# Patient Record
Sex: Male | Born: 1985 | Race: White | Hispanic: No | Marital: Single | Smoking: Never smoker
Health system: Southern US, Community
[De-identification: ages and names within clinical notes are randomized; demographics above are authoritative.]

## PROBLEM LIST (undated history)

## (undated) DIAGNOSIS — Z8614 Personal history of Methicillin resistant Staphylococcus aureus infection: Secondary | ICD-10-CM

## (undated) DIAGNOSIS — Z982 Presence of cerebrospinal fluid drainage device: Secondary | ICD-10-CM

## (undated) DIAGNOSIS — R569 Unspecified convulsions: Secondary | ICD-10-CM

## (undated) DIAGNOSIS — F819 Developmental disorder of scholastic skills, unspecified: Secondary | ICD-10-CM

## (undated) DIAGNOSIS — Q039 Congenital hydrocephalus, unspecified: Secondary | ICD-10-CM

## (undated) HISTORY — DX: Personal history of Methicillin resistant Staphylococcus aureus infection: Z86.14

## (undated) HISTORY — DX: Developmental disorder of scholastic skills, unspecified: F81.9

## (undated) HISTORY — DX: Presence of cerebrospinal fluid drainage device: Z98.2

## (undated) HISTORY — DX: Congenital hydrocephalus, unspecified: Q03.9

## (undated) HISTORY — PX: CIRCUMCISION: SUR203

## (undated) HISTORY — DX: Unspecified convulsions: R56.9

## (undated) HISTORY — PX: VENTRICULOPERITONEAL SHUNT: SHX204

---
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
## 2007-01-10 ENCOUNTER — Ambulatory Visit: Payer: Self-pay | Admitting: Family Medicine

## 2007-02-15 ENCOUNTER — Ambulatory Visit: Payer: Self-pay | Admitting: Family Medicine

## 2007-07-29 ENCOUNTER — Emergency Department (HOSPITAL_COMMUNITY): Admission: EM | Admit: 2007-07-29 | Discharge: 2007-07-29 | Payer: Self-pay | Admitting: Emergency Medicine

## 2008-06-04 ENCOUNTER — Encounter: Admission: RE | Admit: 2008-06-04 | Discharge: 2008-06-04 | Payer: Self-pay | Admitting: Pediatrics

## 2010-04-10 ENCOUNTER — Emergency Department (HOSPITAL_BASED_OUTPATIENT_CLINIC_OR_DEPARTMENT_OTHER): Admission: EM | Admit: 2010-04-10 | Discharge: 2010-04-11 | Payer: Self-pay | Admitting: Emergency Medicine

## 2010-05-01 ENCOUNTER — Emergency Department (HOSPITAL_COMMUNITY): Admission: EM | Admit: 2010-05-01 | Discharge: 2010-05-01 | Payer: Self-pay | Admitting: Emergency Medicine

## 2010-06-08 ENCOUNTER — Emergency Department (HOSPITAL_COMMUNITY): Admission: EM | Admit: 2010-06-08 | Discharge: 2010-06-08 | Payer: Self-pay | Admitting: Emergency Medicine

## 2010-08-23 ENCOUNTER — Emergency Department (HOSPITAL_COMMUNITY): Admission: EM | Admit: 2010-08-23 | Discharge: 2010-08-23 | Payer: Self-pay | Admitting: Emergency Medicine

## 2010-09-30 ENCOUNTER — Emergency Department (HOSPITAL_COMMUNITY)
Admission: EM | Admit: 2010-09-30 | Discharge: 2010-10-01 | Payer: Self-pay | Source: Home / Self Care | Admitting: Emergency Medicine

## 2010-12-15 IMAGING — CR DG LUMBAR SPINE COMPLETE 4+V
6 series · 6 of 6 positions shown · non-contrast
Comparison: None

CLINICAL DATA: Seizure, fall, and low back pain.

LUMBAR SPINE - COMPLETE 4+ VIEW

[t l-spine a.p.]
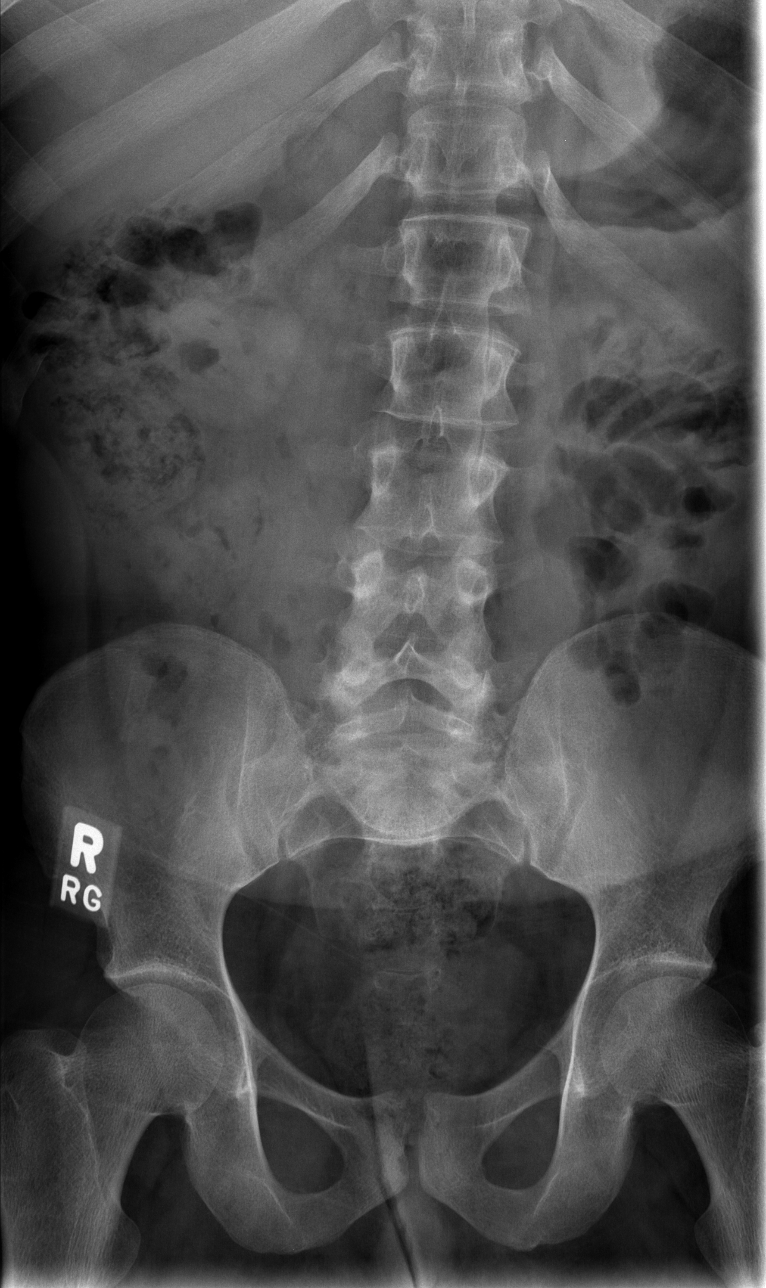

[t l-spine oblique exposure (1 of 3)]
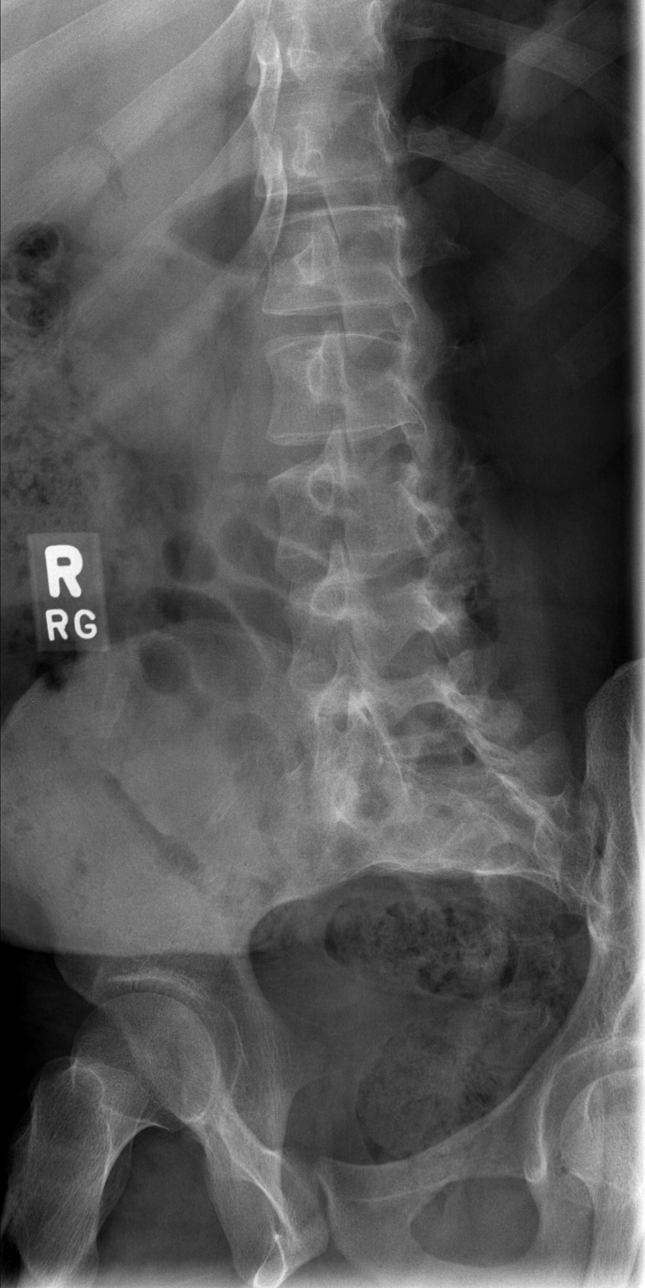

[t l-spine oblique exposure (2 of 3)]
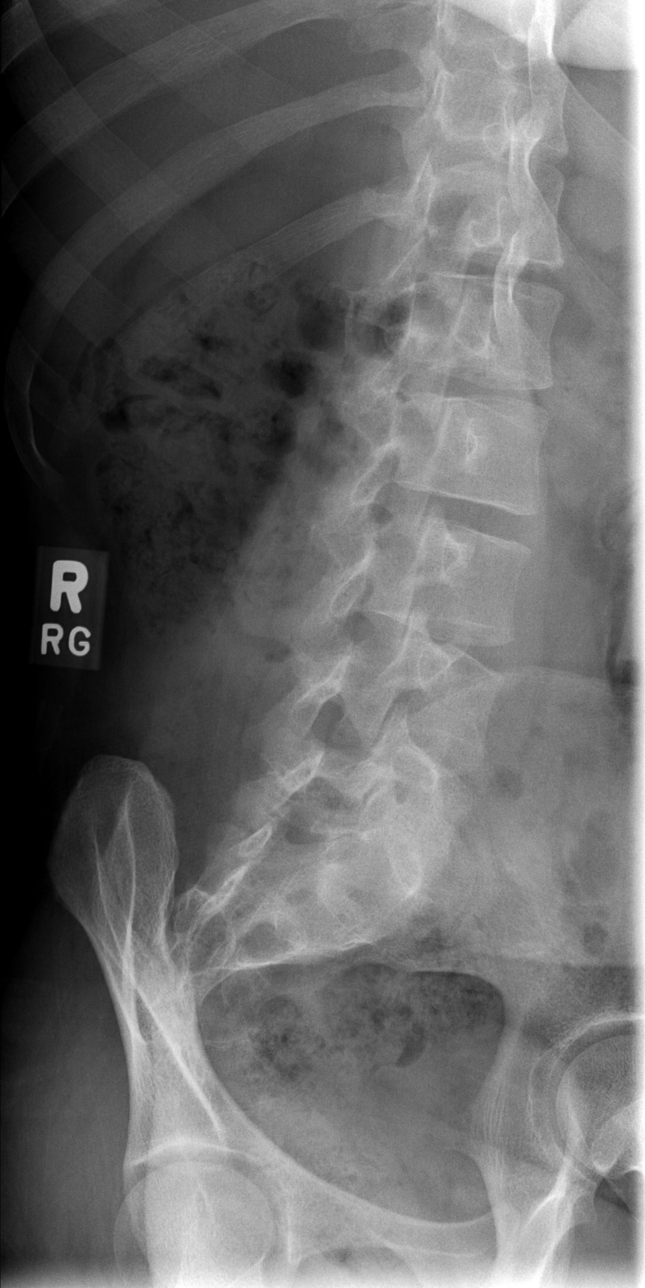

[t l-spine oblique exposure (3 of 3)]
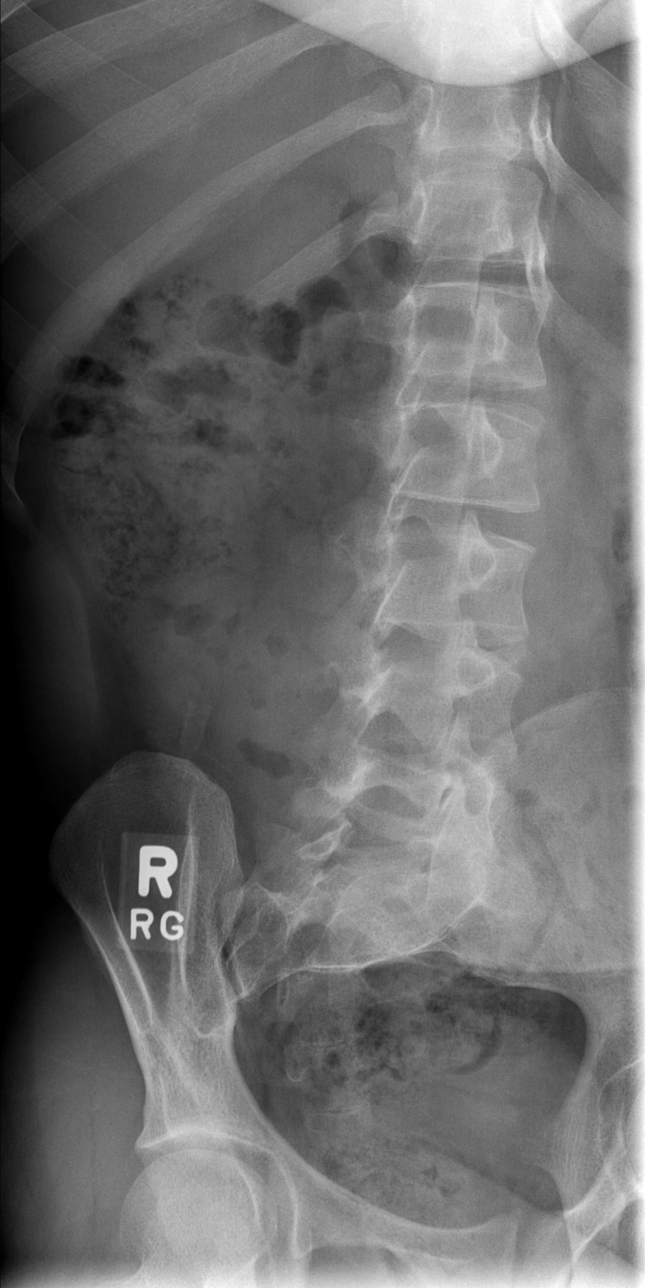

[t l-spine lat]
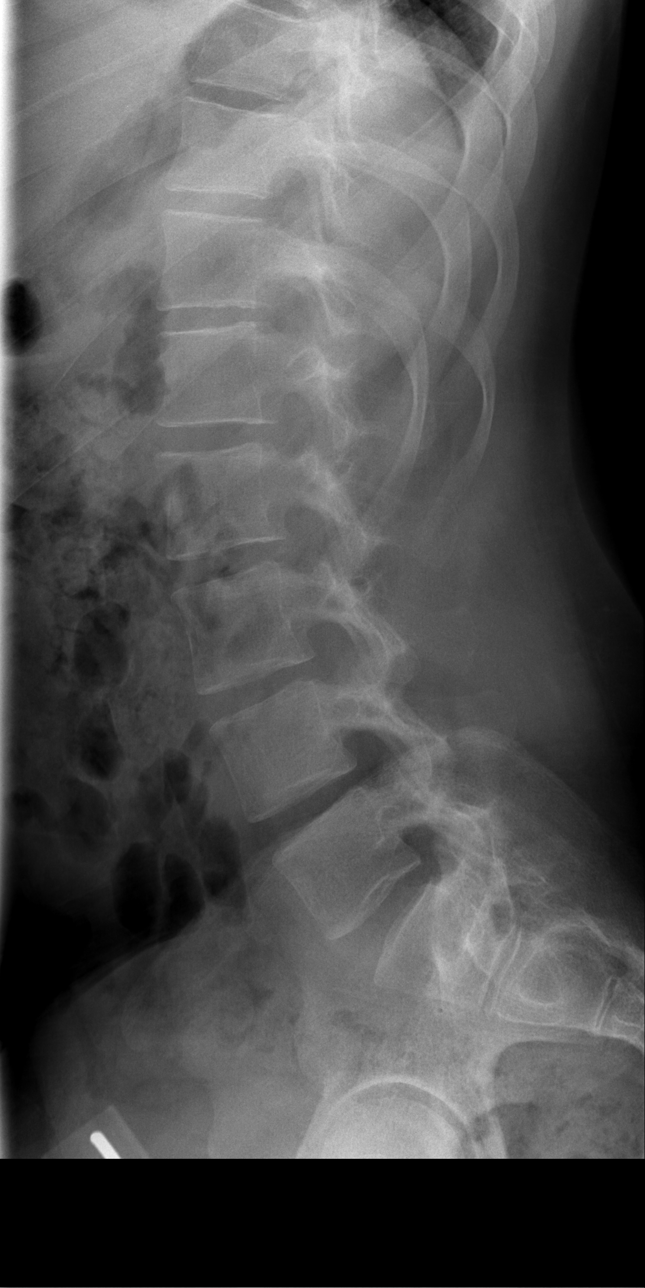

[t l-spine l5-s1 spot]
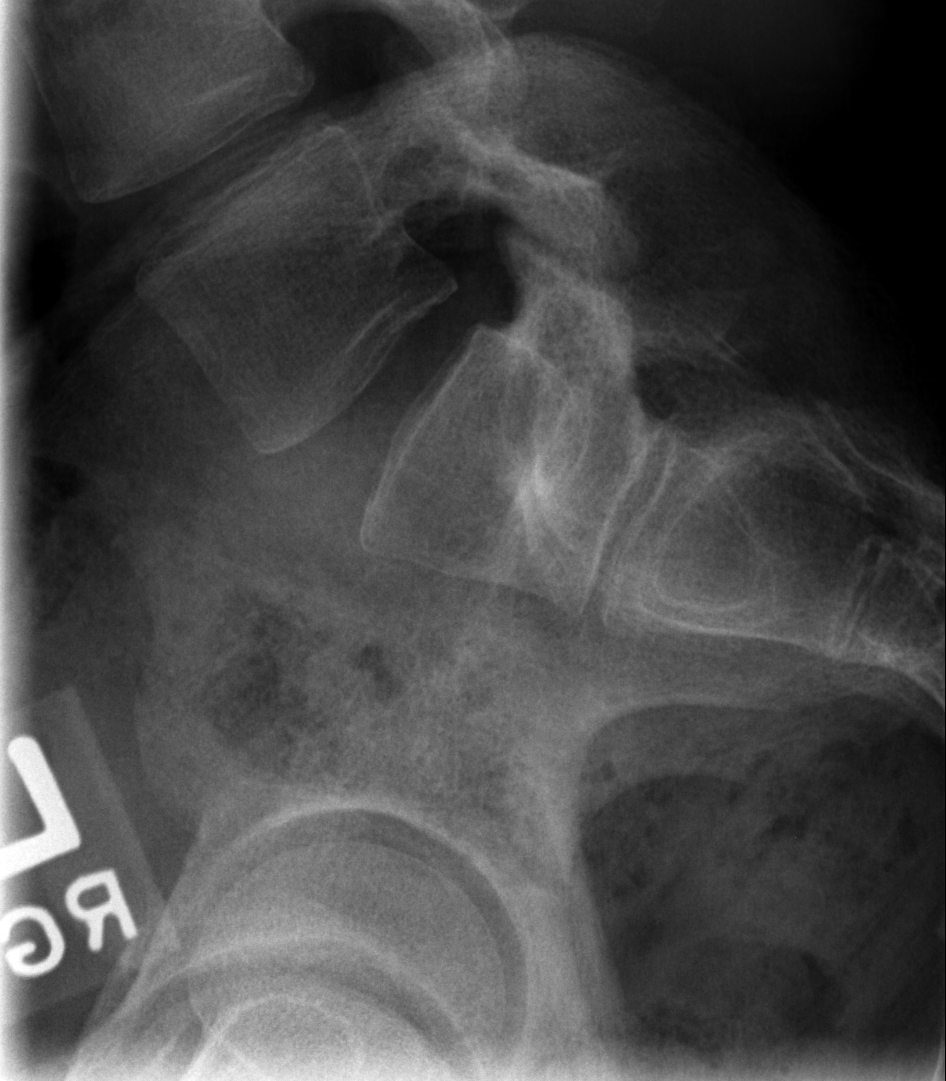

[6 of 6 positions shown; findings below may reference images not displayed]

FINDINGS: Five non-rib bearing lumbar type vertebra are identified
in normal alignment.
There is no evidence of acute fracture or subluxation.
The disc spaces are maintained.
No evidence of spondylolysis or focal bony lesions noted.
IMPRESSION: Unremarkable lumbar spine series.

## 2010-12-15 IMAGING — CR DG CERVICAL SPINE COMPLETE 4+V
8 series · 8 of 8 positions shown · non-contrast
Comparison: None

CLINICAL DATA: Seizure, fall and neck pain.

CERVICAL SPINE - COMPLETE 4+ VIEW

[w c-spine lat]
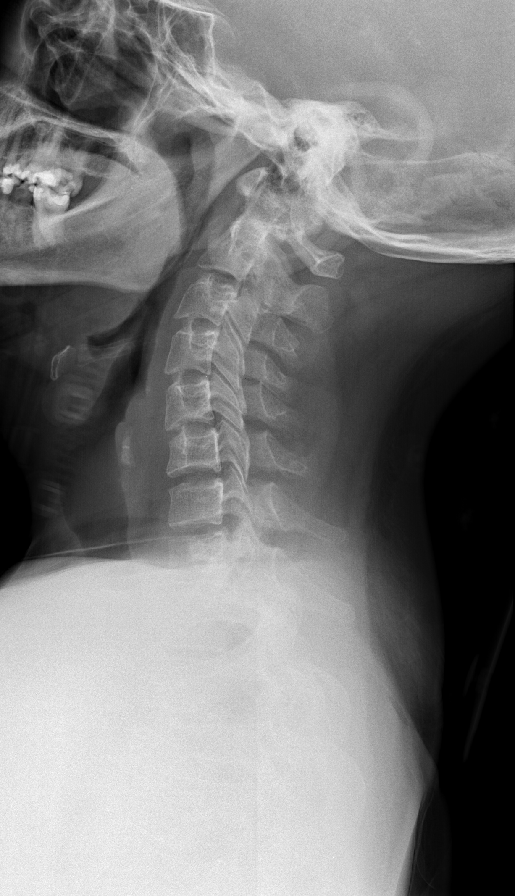

[w c-spine oblique (1 of 3)]
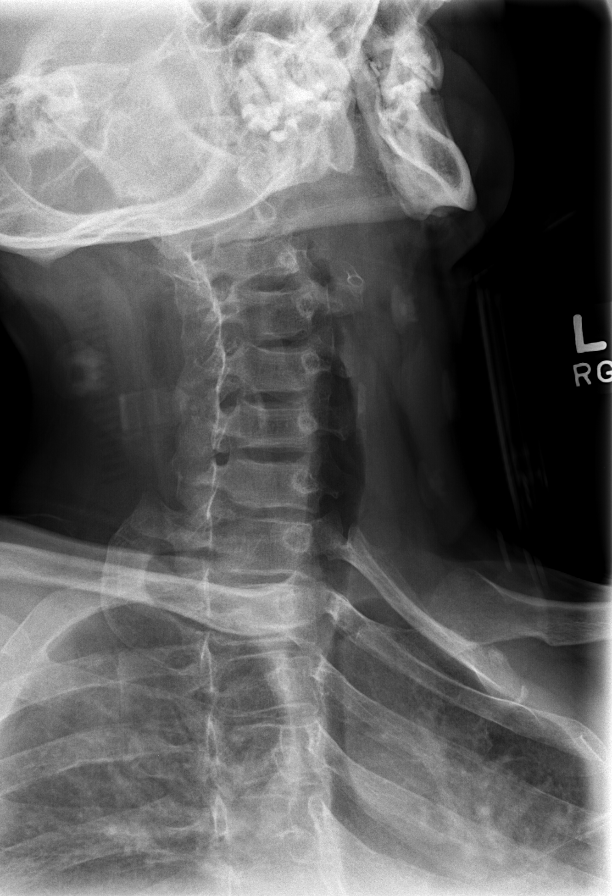

[w c-spine oblique (2 of 3)]
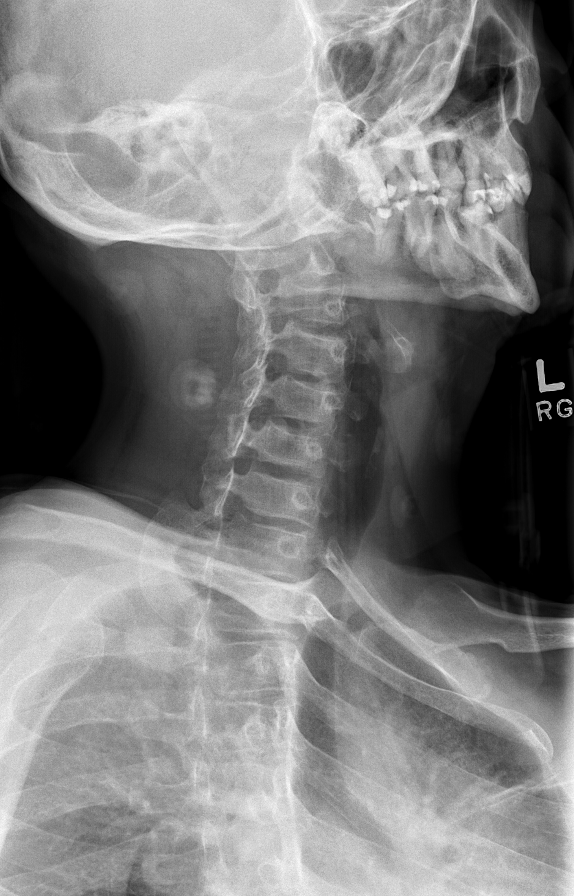

[w c-spine oblique (3 of 3)]
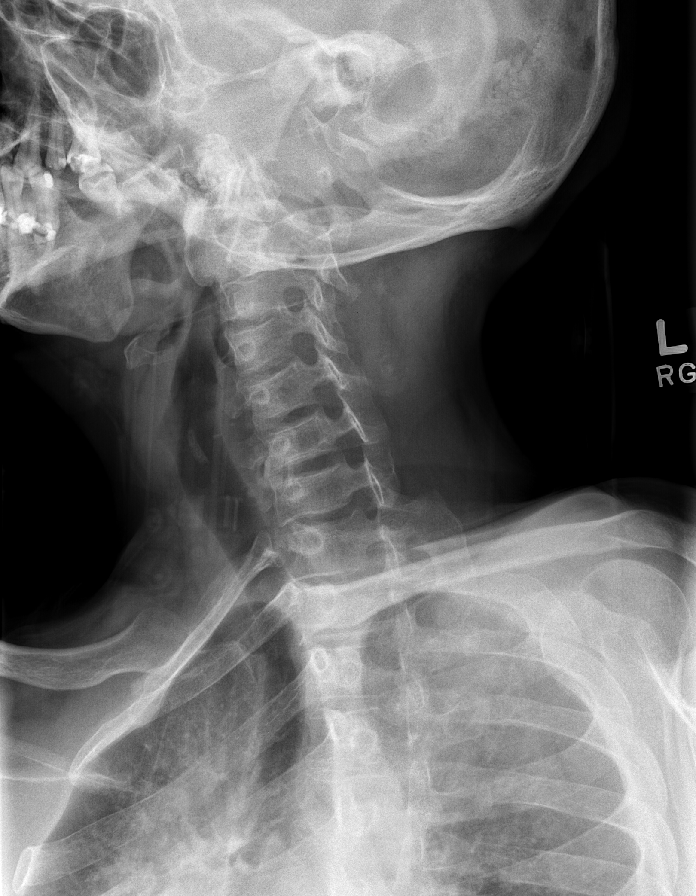

[w c-spine a.p. *]
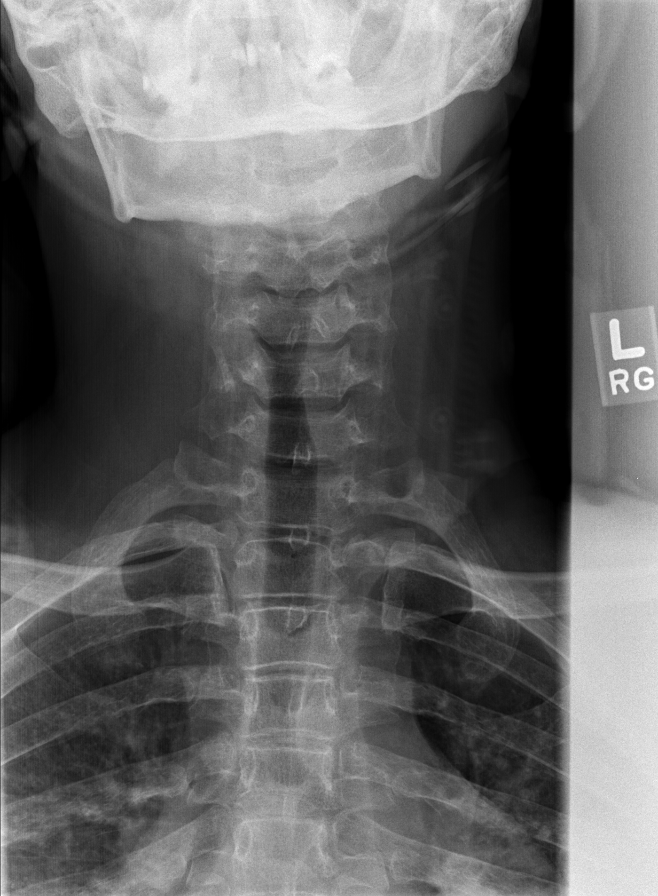

[w c-spine odontoid * (1 of 3)]
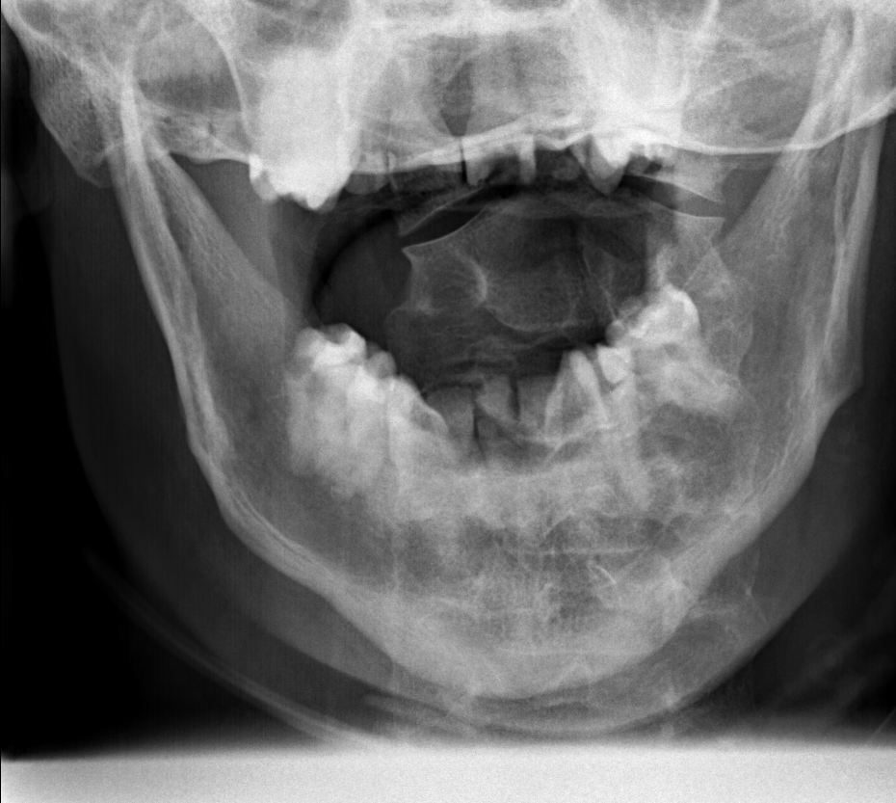

[w c-spine odontoid * (2 of 3)]
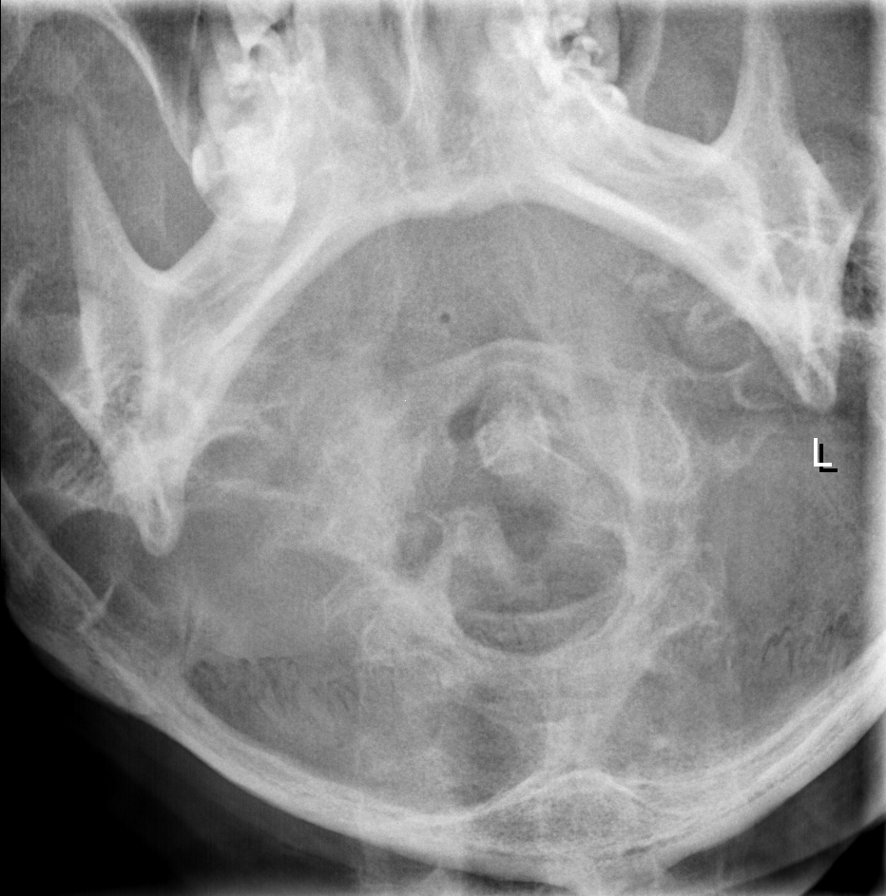

[w c-spine odontoid * (3 of 3)]
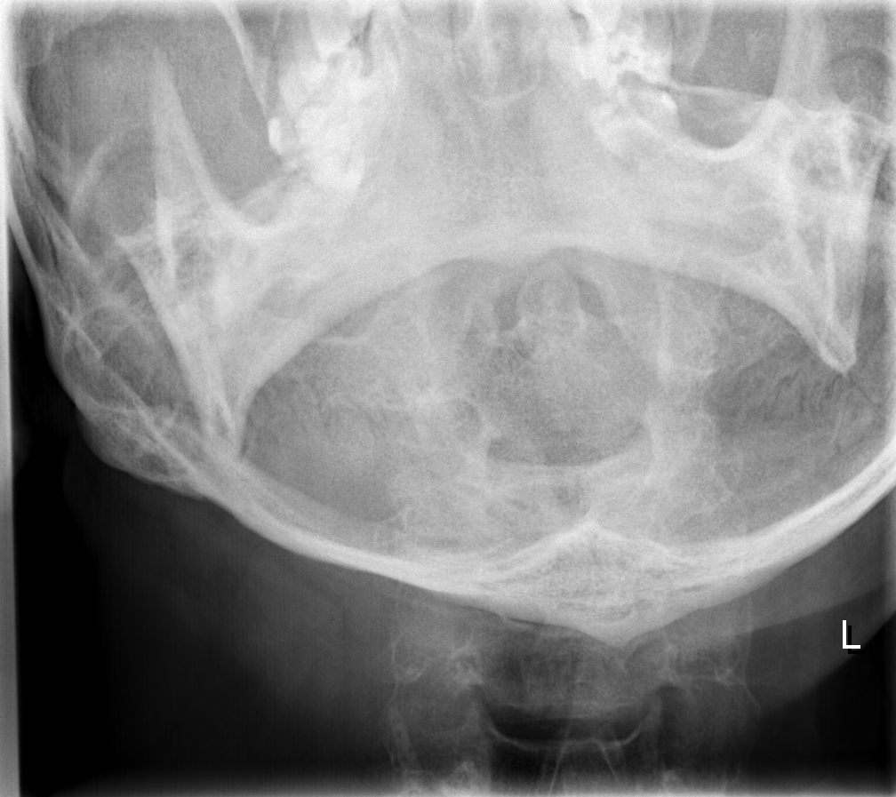

[8 of 8 positions shown; findings below may reference images not displayed]

FINDINGS: Normal alignment is noted.
There is no evidence of acute fracture, subluxation, or
prevertebral soft tissue swelling.
The disc spaces are maintained.
No focal bony lesions are identified.
IMPRESSION: No static evidence of acute injury to the cervical spine.

## 2010-12-21 LAB — URINE MICROSCOPIC-ADD ON

## 2010-12-21 LAB — BASIC METABOLIC PANEL WITH GFR
BUN: 6 mg/dL (ref 6–23)
Creatinine, Ser: 0.69 mg/dL (ref 0.4–1.5)
GFR calc non Af Amer: >60 mL/min (ref >60)
Sodium: 127 meq/L — ABNORMAL LOW (ref 135–145)

## 2010-12-21 LAB — DIFFERENTIAL
Basophils Absolute: 0 10*3/uL (ref 0.0–0.1)
Basophils Relative: 0 % (ref 0–1)
Eosinophils Absolute: 0.1 10*3/uL (ref 0.0–0.7)
Eosinophils Relative: 1 % (ref 0–5)
Lymphocytes Relative: 23 % (ref 12–46)
Lymphs Abs: 2.4 K/uL (ref 0.7–4.0)
Monocytes Absolute: 0.8 10*3/uL (ref 0.1–1.0)
Monocytes Relative: 7 % (ref 3–12)
Neutro Abs: 7.3 K/uL (ref 1.7–7.7)
Neutrophils Relative %: 68 % (ref 43–77)

## 2010-12-21 LAB — CBC
HCT: 38.4 % — ABNORMAL LOW (ref 39.0–52.0)
Hemoglobin: 13.8 g/dL (ref 13.0–17.0)
MCH: 29.7 pg (ref 26.0–34.0)
MCHC: 35.9 g/dL (ref 30.0–36.0)
MCV: 82.6 fL (ref 78.0–100.0)
Platelets: 300 K/uL (ref 150–400)
RBC: 4.65 MIL/uL (ref 4.22–5.81)
RDW: 11.8 % (ref 11.5–15.5)
WBC: 10.6 K/uL — ABNORMAL HIGH (ref 4.0–10.5)

## 2010-12-21 LAB — URINALYSIS, ROUTINE W REFLEX MICROSCOPIC
Bilirubin Urine: NEGATIVE
Glucose, UA: NEGATIVE mg/dL
Ketones, ur: NEGATIVE mg/dL
Leukocytes, UA: NEGATIVE
Nitrite: NEGATIVE
Protein, ur: NEGATIVE mg/dL
Specific Gravity, Urine: 1.013 (ref 1.005–1.030)
Urobilinogen, UA: 0.2 mg/dL (ref 0.0–1.0)
pH: 5.5 (ref 5.0–8.0)

## 2010-12-21 LAB — BASIC METABOLIC PANEL
CO2: 26 mEq/L (ref 19–32)
Calcium: 9.6 mg/dL (ref 8.4–10.5)
Chloride: 92 mEq/L — ABNORMAL LOW (ref 96–112)
GFR calc Af Amer: >60 mL/min (ref >60)
Glucose, Bld: 89 mg/dL (ref 70–99)
Potassium: 3.4 mEq/L — ABNORMAL LOW (ref 3.5–5.1)

## 2010-12-21 LAB — VALPROIC ACID LEVEL: Valproic Acid Lvl: 89.4 ug/mL (ref 50.0–100.0)

## 2010-12-22 LAB — DIFFERENTIAL
Eosinophils Absolute: 0.3 10*3/uL (ref 0.0–0.7)
Lymphocytes Relative: 26 % (ref 12–46)
Lymphs Abs: 1.9 10*3/uL (ref 0.7–4.0)

## 2010-12-22 LAB — BASIC METABOLIC PANEL
Chloride: 91 mEq/L — ABNORMAL LOW (ref 96–112)
GFR calc Af Amer: >60 mL/min (ref >60)
GFR calc non Af Amer: >60 mL/min (ref >60)
Potassium: 4.4 mEq/L (ref 3.5–5.1)

## 2010-12-22 LAB — RAPID URINE DRUG SCREEN, HOSP PERFORMED
Benzodiazepines: NOT DETECTED
Opiates: NOT DETECTED
Tetrahydrocannabinol: NOT DETECTED

## 2010-12-22 LAB — URINALYSIS, ROUTINE W REFLEX MICROSCOPIC
Leukocytes, UA: NEGATIVE
Nitrite: NEGATIVE
Protein, ur: NEGATIVE mg/dL
Urobilinogen, UA: 0.2 mg/dL (ref 0.0–1.0)

## 2010-12-22 LAB — CBC
HCT: 40.9 % (ref 39.0–52.0)
MCH: 30.3 pg (ref 26.0–34.0)
RDW: 12.5 % (ref 11.5–15.5)

## 2010-12-22 LAB — GLUCOSE, CAPILLARY: Glucose-Capillary: 95 mg/dL (ref 70–99)

## 2010-12-22 LAB — VALPROIC ACID LEVEL: Valproic Acid Lvl: 43.3 ug/mL — ABNORMAL LOW (ref 50.0–100.0)

## 2010-12-25 LAB — URINALYSIS, ROUTINE W REFLEX MICROSCOPIC
Nitrite: NEGATIVE
Protein, ur: 30 mg/dL — AB
Specific Gravity, Urine: 1.02 (ref 1.005–1.030)
Urobilinogen, UA: 1 mg/dL (ref 0.0–1.0)
pH: 6 (ref 5.0–8.0)

## 2010-12-25 LAB — URINE MICROSCOPIC-ADD ON

## 2010-12-25 LAB — VALPROIC ACID LEVEL: Valproic Acid Lvl: 92.7 ug/mL (ref 50.0–100.0)

## 2010-12-26 LAB — BASIC METABOLIC PANEL
BUN: 8 mg/dL (ref 6–23)
Creatinine, Ser: 0.73 mg/dL (ref 0.4–1.5)
Glucose, Bld: 97 mg/dL (ref 70–99)
Sodium: 134 mEq/L — ABNORMAL LOW (ref 135–145)

## 2010-12-26 LAB — RAPID URINE DRUG SCREEN, HOSP PERFORMED
Amphetamines: NOT DETECTED
Barbiturates: NOT DETECTED
Benzodiazepines: NOT DETECTED
Cocaine: NOT DETECTED

## 2010-12-26 LAB — URINALYSIS, ROUTINE W REFLEX MICROSCOPIC
Glucose, UA: NEGATIVE mg/dL
Hgb urine dipstick: NEGATIVE
Ketones, ur: NEGATIVE mg/dL
Protein, ur: NEGATIVE mg/dL
pH: 7.5 (ref 5.0–8.0)

## 2010-12-26 LAB — DIFFERENTIAL
Eosinophils Absolute: 0.1 10*3/uL (ref 0.0–0.7)
Eosinophils Relative: 1 % (ref 0–5)
Lymphocytes Relative: 22 % (ref 12–46)
Lymphs Abs: 2.1 10*3/uL (ref 0.7–4.0)
Monocytes Relative: 5 % (ref 3–12)

## 2010-12-26 LAB — CBC
Platelets: 368 10*3/uL (ref 150–400)
RDW: 12.7 % (ref 11.5–15.5)

## 2011-02-26 NOTE — Procedures (Signed)
EEG NUMBER:  [id number]   HISTORY OF PRESENT ILLNESS:  This is a who is on Depakote for a single  seizure a year ago with no recurrent seizures.  Ordered by Dr. Delaney Meigs, M.D. and also Dr. Ellison Carwin.   FINDINGS:  The patient is described as being awake and alert clinically  throughout the study. Electrographically, he also appears to be awake and  alert.  This is a 17 channel routine EEG with one channel devoted to EKG  utilizing [employer] 10/20 lead placement system.  The background  consists of an admixture of 8-10 Hz alpha activity which is fairly well  developed, organized and modulated.  It is predominant in the posterior head  regions and reactive to eye opening.  No clear interhemispheric asymmetry is  identified and no definite epileptiform discharges seen although I  questioned some small sharp waves with field around C3, P3 although these do  not really stand out significantly from the background.  No clinical seizure  activity is noted.  Activation procedures including both hyperventilation  and photic stimulation were performed and did not produce any significant  change in the background activity.  The EKG monitor reveals relatively  regular rhythm with a rate of 78 beats per minute.   CONCLUSION:  Essentially normal EEG without definite seizure activity as  described.  I question some small sharp wave activity emanating from the C3,  P3 region which would be in the left parietal region but these did stand out  appreciably from the background clinical correlation.      Catherine A. Orlin Hilding, M.D.  Electronically Signed     [id number]  D:  10/25/2005 14:06:59  T:  10/25/2005 22:10:42  Job #:  [id number]   cc:   Deanna Artis. Sharene Skeans, M.D.  Fax: [phone]   Delaney Meigs, M.D.  Fax: [phone]

## 2013-03-07 ENCOUNTER — Telehealth: Payer: Self-pay

## 2013-03-07 DIAGNOSIS — G40309 Generalized idiopathic epilepsy and epileptic syndromes, not intractable, without status epilepticus: Secondary | ICD-10-CM

## 2013-03-07 MED ORDER — LACOSAMIDE 100 MG PO TABS: ORAL_TABLET | ORAL | Status: DC

## 2013-03-07 NOTE — Telephone Encounter (Addendum)
[name] lvm stating that pt is about to run out of Vimpat and is on the pt assistance program. She asked that refill be called in. I called mom to get clarification. The last pt assistant packet was sent in and approved in October 17, 2012 for a 6 month supply which was shipped to our office and picked up by mom. The medication was then increased at office visit on October 26, 2012.

## 2013-03-07 NOTE — Telephone Encounter (Signed)
Randall Anderson called me back and said that Hay[place] now has Medicaid. She said that the Medicaid ID number we have on file is correct. She asked that the Vimpat Rx be sent in to [facility].

## 2013-03-07 NOTE — Telephone Encounter (Signed)
Please let Mom know that the Rx has been sent to the pharmacy as requested. TG

## 2013-03-08 NOTE — Telephone Encounter (Signed)
I called [name] and let her know the Rx was at the pharmacy.

## 2013-04-04 ENCOUNTER — Other Ambulatory Visit: Payer: Self-pay | Admitting: Family

## 2013-04-04 DIAGNOSIS — G40309 Generalized idiopathic epilepsy and epileptic syndromes, not intractable, without status epilepticus: Secondary | ICD-10-CM

## 2013-04-04 MED ORDER — DIVALPROEX SODIUM ER 500 MG PO TB24: ORAL_TABLET | ORAL | Status: DC

## 2013-04-17 DIAGNOSIS — Z8614 Personal history of Methicillin resistant Staphylococcus aureus infection: Secondary | ICD-10-CM

## 2013-04-17 DIAGNOSIS — G40309 Generalized idiopathic epilepsy and epileptic syndromes, not intractable, without status epilepticus: Secondary | ICD-10-CM

## 2013-04-17 DIAGNOSIS — G44219 Episodic tension-type headache, not intractable: Secondary | ICD-10-CM | POA: Insufficient documentation

## 2013-04-17 DIAGNOSIS — Z79899 Other long term (current) drug therapy: Secondary | ICD-10-CM

## 2013-04-17 DIAGNOSIS — G911 Obstructive hydrocephalus: Secondary | ICD-10-CM | POA: Insufficient documentation

## 2013-04-17 DIAGNOSIS — F39 Unspecified mood [affective] disorder: Secondary | ICD-10-CM | POA: Insufficient documentation

## 2013-04-17 DIAGNOSIS — F329 Major depressive disorder, single episode, unspecified: Secondary | ICD-10-CM | POA: Insufficient documentation

## 2013-05-15 ENCOUNTER — Telehealth: Payer: Self-pay

## 2013-05-15 DIAGNOSIS — G40309 Generalized idiopathic epilepsy and epileptic syndromes, not intractable, without status epilepticus: Secondary | ICD-10-CM

## 2013-05-15 MED ORDER — DIVALPROEX SODIUM ER 500 MG PO TB24: ORAL_TABLET | ORAL | Status: DC

## 2013-05-15 NOTE — Telephone Encounter (Signed)
Randall Anderson lvm stating that pt is completely out of divalproex. I called mom and she said that she contacted the pharmacy last week and they were supposed to get in contact with our office to get refill approval. I told her that it would be done today and check with the pharmacy later today.

## 2013-05-15 NOTE — Telephone Encounter (Signed)
Rx was sent electronically to pharmacy. TG

## 2013-05-17 ENCOUNTER — Ambulatory Visit (INDEPENDENT_AMBULATORY_CARE_PROVIDER_SITE_OTHER): Payer: Medicaid Other | Admitting: Family

## 2013-05-17 ENCOUNTER — Encounter: Payer: Self-pay | Admitting: Family

## 2013-05-17 VITALS — BP 126/76 | HR 80 | Ht 60.5 in | Wt 124.6 lb

## 2013-05-17 DIAGNOSIS — G44219 Episodic tension-type headache, not intractable: Secondary | ICD-10-CM

## 2013-05-17 DIAGNOSIS — Z8614 Personal history of Methicillin resistant Staphylococcus aureus infection: Secondary | ICD-10-CM

## 2013-05-17 DIAGNOSIS — Z79899 Other long term (current) drug therapy: Secondary | ICD-10-CM

## 2013-05-17 DIAGNOSIS — F39 Unspecified mood [affective] disorder: Secondary | ICD-10-CM

## 2013-05-17 DIAGNOSIS — G40309 Generalized idiopathic epilepsy and epileptic syndromes, not intractable, without status epilepticus: Secondary | ICD-10-CM

## 2013-05-17 DIAGNOSIS — G911 Obstructive hydrocephalus: Secondary | ICD-10-CM

## 2013-05-17 DIAGNOSIS — F329 Major depressive disorder, single episode, unspecified: Secondary | ICD-10-CM

## 2013-05-17 MED ORDER — DIVALPROEX SODIUM ER 500 MG PO TB24: ORAL_TABLET | ORAL | Status: DC

## 2013-05-17 MED ORDER — LEVETIRACETAM 500 MG PO TABS: ORAL_TABLET | ORAL | Status: DC

## 2013-05-17 NOTE — Patient Instructions (Signed)
Increase Levetiracetam (Keppra) 500mg  to 3 tablets in the morning and 3 tablets in the evening. I have sent in a new prescription to the pharmacy for you. Continue taking Vimpat and Divalproex (Depakote) as you have been.  Call me if the small seizures do not improve or if Ethel has side effects from the increased dose of Levetiracetam. Plan to return for follow up in 6 months or sooner if needed.

## 2013-05-17 NOTE — Progress Notes (Signed)
Patient: ALBION WEATHERHOLTZ MRN: [id number] Sex: male DOB: 1986-02-23  Provider: Elveria Rising, NP Location of Care: [facility]  Note type: Routine return visit  History of Present Illness: Referral Source: Dr. Joette Catching History from: Mother Chief Complaint: Seizure Disorder  SAMIL MECHAM is a 27 y.o. male with history of congenital hydrocephalus, VP shunt, cognitive delays, and generalized seizures.  He is taking and tolerating generic Depakote, Levetiracetam and Vimpat for his seizure disorder.   His family reports that he has not had convulsive seizures in more than 6 months. They say that he is having "small seizures" described as staring or staring with some jerks of his limbs with no loss of consciousness occurs 1-2 times per week. He usually has a severe headache after this type of seizure.   [name] is in a better mood today than in previous visits and his family says that his mood is better overall. He continues to not sleep well at night because he fears having a seizure in his sleep and dying.    Review of Systems: 12 system review was remarkable for headache, seizure and tremor  Past Medical History  Diagnosis Date  . Seizures    Hospitalizations: yes, Head Injury: no, Nervous System Infections: no, Immunizations up to date: yes Past Medical History Comments: See surgical Hx for hospitalizations. Past medication history significant for congenital hydrocephalus, headaches and seizures.   Surgical History Past Surgical History  Procedure Laterality Date  . Ventriculoperitoneal shunt      P shunt,  endoscopic surgery to open up the floor of third ventricle and have it communicate with the subarachnoid space.     Family History family history includes Cancer in his father, maternal grandfather, and paternal grandfather; Congestive Heart Failure in his maternal grandmother; and Diabetes in his other. Family History is negative migraines,  seizures, cognitive impairment, blindness, deafness, birth defects, chromosomal disorder, autism.  Social History History   Social History  . Marital Status: Single    Spouse Name: N/A    Number of Children: N/A  . Years of Education: N/A   Social History Main Topics  . Smoking status: Never Smoker   . Smokeless tobacco: Never Used  . Alcohol Use: No  . Drug Use: No  . Sexually Active: None   Other Topics Concern  . None   Social History Narrative  . None   Living with mother and sister    Allergies  Allergen Reactions  . Other Anaphylaxis    Shell Fish    Physical Exam BP 126/76  Pulse 80  Ht 5' 0.5" (1.537 m)  Wt 124 lb 9.6 oz (56.518 kg)  BMI 23.92 kg/m2 General: well developed, well nourished young man with short stature, seated on exam table, in no evident distress Head: head is large and brachycephalic Ears, Nose and Throat: poor dentition Neck: supple with no carotid or supraclavicular bruits. Respiratory: lungs clear to auscultation Cardiovascular: regular rate and rhythm, no murmurs Musculoskeletal: neuromuscular scoliosis - stable Skin: no rashes or lesions  Neurologic Exam  Mental Status: Awake and fully alert.  Oriented to place and time.  He had difficulty in answering questions and looked to his mother for help.  Cranial Nerves: Fundoscopic exam reveals sharp disc margins.  Pupils equal, briskly reactive to light.  Extraocular movements full without nystagmus.  Visual fields full to confrontation.  Hearing intact and symmetric to finger rub.  Facial sensation intact.  Face, tongue, palate move normally and symmetrically.  Neck flexion and extension normal. Motor: Normal bulk and tone.  Normal strength in all tested extremity muscles. Sensory: Refused to answer questions about sensory examination. Coordination: Rapid alternating movements slightly clumsy but otherwise normal in all extremities.  Finger-to-nose and heel-to-shin performed accurately  bilaterally. Romberg negative. Gait and Station: Arises from chair without difficulty.  Stance is normal.  Gait demonstrates normal stride length and balance.  Able to heel, toe, and tandem walk were clumsy but able to do performed without significant difficulty. Reflexes: 1+ and symmetric.  Toes downgoing.  Assessment and Plan Kalep is a 27 year old young man with history of congenital hydrocephalus, VP shunt and generalized seizures. He is taking and tolerating generic Depakote, Levetiracetam and Vimpat for his seizure disorder. The Vimpat has decreased his seizure frequency slightly but he continues to have what sound like partial seizures a few times per week. I instructed his mother to increase Levetiracetam to 3 tablets twice per day. I asked her to call me if he has changes in his mood or becomes sleepy with this dose increase.  I will see him back in follow up in 6 months but asked his mother to stay in touch by phone about his seizures so that we may make adjustments to his antiepileptic medication.

## 2013-09-17 ENCOUNTER — Telehealth: Payer: Self-pay

## 2013-09-17 DIAGNOSIS — G40309 Generalized idiopathic epilepsy and epileptic syndromes, not intractable, without status epilepticus: Secondary | ICD-10-CM

## 2013-09-17 MED ORDER — VIMPAT 100 MG PO TABS: ORAL_TABLET | ORAL | Status: DC

## 2013-09-17 NOTE — Telephone Encounter (Signed)
Rx faxed today as requested. TG

## 2013-09-17 NOTE — Telephone Encounter (Signed)
[name] called and said that pt has been out of Vimpat since yesterday. She is requesting a refill be sent to [facility] on [address]. I told her that we will send over refill and to check w pharmacy in a little while.

## 2013-11-19 ENCOUNTER — Ambulatory Visit (INDEPENDENT_AMBULATORY_CARE_PROVIDER_SITE_OTHER): Payer: Medicaid Other | Admitting: Family

## 2013-11-19 ENCOUNTER — Encounter: Payer: Self-pay | Admitting: Family

## 2013-11-19 VITALS — BP 120/74 | HR 84 | Ht 59.0 in | Wt 117.4 lb

## 2013-11-19 DIAGNOSIS — Z79899 Other long term (current) drug therapy: Secondary | ICD-10-CM

## 2013-11-19 DIAGNOSIS — G911 Obstructive hydrocephalus: Secondary | ICD-10-CM

## 2013-11-19 DIAGNOSIS — G40309 Generalized idiopathic epilepsy and epileptic syndromes, not intractable, without status epilepticus: Secondary | ICD-10-CM

## 2013-11-19 DIAGNOSIS — F3289 Other specified depressive episodes: Secondary | ICD-10-CM

## 2013-11-19 DIAGNOSIS — F39 Unspecified mood [affective] disorder: Secondary | ICD-10-CM

## 2013-11-19 DIAGNOSIS — G44219 Episodic tension-type headache, not intractable: Secondary | ICD-10-CM

## 2013-11-19 DIAGNOSIS — Z8614 Personal history of Methicillin resistant Staphylococcus aureus infection: Secondary | ICD-10-CM

## 2013-11-19 DIAGNOSIS — F329 Major depressive disorder, single episode, unspecified: Secondary | ICD-10-CM

## 2013-11-19 MED ORDER — VIMPAT 100 MG PO TABS: ORAL_TABLET | ORAL | Status: DC

## 2013-11-19 MED ORDER — LEVETIRACETAM 500 MG PO TABS: ORAL_TABLET | ORAL | Status: DC

## 2013-11-19 MED ORDER — DIVALPROEX SODIUM ER 500 MG PO TB24
ORAL_TABLET | ORAL | Status: DC
Start: 2013-11-19 — End: 2014-06-20

## 2013-11-19 NOTE — Progress Notes (Signed)
Patient: Randall Anderson MRN: [id number] Sex: male DOB: May 14, 1986  Provider: Elveria RisingGOODPASTURE, Vernell Back, NP Location of Care: [facility]  Note type: Routine return visit  History of Present Illness: Referral Source: Dr. Joette CatchingLeonard Nyland History from: his mother Chief Complaint: Seizure Disorder  Randall Anderson is a 28 y.o. young man with history of congenital hydrocephalus, VP shunt, cognitive delays, and generalized seizures. He is taking and tolerating generic Depakote, Levetiracetam and Vimpat for his seizure disorder. His mother reports today that Randall Anderson has been doing well since last seen in October 2014. He has occasional "small seizures" described as staring or staring with some jerks of his limbs with no loss of consciousness about once per week. He sometimes has a severe headache after this type of seizure.   Randall Anderson has been healthy since last seen. 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 His mood has been more even and he has been sleeping better.   Review of Systems: 12 system review was remarkable for headache  Past Medical History  Diagnosis Date  . Seizures   . Congenital hydrocephalus   . VP (ventriculoperitoneal) shunt status   . Intellectual delay   . Hx MRSA infection    Hospitalizations: no, Head Injury: no, Nervous System Infections: no, Immunizations up to date: yes Past Medical History Comments: no serious illness since last seen  Surgical History Past Surgical History  Procedure Laterality Date  . Ventriculoperitoneal shunt      P shunt,  endoscopic surgery to open up the floor of third ventricle and have it communicate with the subarachnoid space.     Family History family history includes Cancer in his father, maternal grandfather, and paternal grandfather; Congestive Heart Failure in his maternal grandmother; Diabetes in his other. Family History  is otherwise negative for migraines, seizures, cognitive impairment, blindness, deafness, birth defects, chromosomal disorder, autism.  Social History History   Social History  . Marital Status: Single    Spouse Name: N/A    Number of Children: N/A  . Years of Education: N/A   Social History Main Topics  . Smoking status: Passive Smoke Exposure - Never Smoker  . Smokeless tobacco: Never Used  . Alcohol Use: No  . Drug Use: No  . Sexual Activity: No   Other Topics Concern  . None   Social History Narrative  . None   Educational level: 12th grade School Attending:N/A Living with: mother  Hobbies/Interest: Watching T.V., crossword puzzles, going to church and playing on his computer School comments: Randall Anderson graduated from [employer] in 2008.   Physical Exam BP 120/74  Pulse 84  Ht 4\' 11"  (1.499 m)  Wt 117 lb 6.4 oz (53.252 kg)  BMI 23.70 kg/m2 General: well developed, well nourished young man with short stature, seated on exam table, in no evident distress  Head: head is large and brachycephalic  Ears, Nose and Throat: poor dentition  Neck: supple with no carotid or supraclavicular bruits.  Respiratory: lungs clear to auscultation  Cardiovascular: regular rate and rhythm, no murmurs  Musculoskeletal: neuromuscular scoliosis - stable  Skin: no rashes or lesions   Neurologic Exam  Mental Status: Awake and fully alert. Oriented to place and time. He had difficulty in answering questions and looked to his mother for help.  Cranial Nerves: Fundoscopic exam reveals sharp disc margins. Pupils equal, briskly reactive to light. Extraocular movements full  without nystagmus. Visual fields full to confrontation. Hearing intact and symmetric to finger rub. Facial sensation intact. Face, tongue, palate move normally and symmetrically. Neck flexion and extension normal.  Motor: Normal bulk and tone. Normal strength in all tested extremity muscles.  Sensory: Refused to answer  questions about sensory examination.  Coordination: Rapid alternating movements slightly clumsy but otherwise normal in all extremities. Finger-to-nose and heel-to-shin performed accurately bilaterally. Romberg negative.  Gait and Station: Arises from chair without difficulty. Stance is normal. Gait demonstrates normal stride length and balance. Able to heel, toe, and tandem walk were clumsy but able to do performed without significant difficulty.  Reflexes: 1+ and symmetric. Toes downgoing.   Assessment and Plan Randall Anderson is a 28 year old young man with history of congenital hydrocephalus, VP shunt and generalized seizures. He is taking and tolerating generic Depakote, Levetiracetam and Vimpat for his seizure disorder. He continues to have brief complex partial seizures about once per week but his family is satisfied with his seizure control because he is no longer having generalized tonic clonic seizures and because his mood has greatly improved.  I will see him back in follow up in 6 months. I asked his mother to call me in the interim if his seizures increased in frequency or severity.

## 2013-11-21 ENCOUNTER — Encounter: Payer: Self-pay | Admitting: Family

## 2013-11-21 NOTE — Patient Instructions (Addendum)
Continue [name]'s medication without change. Call me if his seizures become more frequent or get severe. I will see him back in follow up in 6 months or sooner if needed.

## 2013-12-19 ENCOUNTER — Other Ambulatory Visit: Payer: Self-pay | Admitting: Family

## 2014-06-10 ENCOUNTER — Other Ambulatory Visit: Payer: Self-pay | Admitting: Family

## 2014-06-20 ENCOUNTER — Ambulatory Visit (INDEPENDENT_AMBULATORY_CARE_PROVIDER_SITE_OTHER): Payer: Medicaid Other | Admitting: Family

## 2014-06-20 ENCOUNTER — Encounter: Payer: Self-pay | Admitting: Family

## 2014-06-20 VITALS — BP 126/72 | HR 86 | Ht 59.0 in | Wt 116.0 lb

## 2014-06-20 DIAGNOSIS — G911 Obstructive hydrocephalus: Secondary | ICD-10-CM

## 2014-06-20 DIAGNOSIS — Z79899 Other long term (current) drug therapy: Secondary | ICD-10-CM

## 2014-06-20 DIAGNOSIS — G40309 Generalized idiopathic epilepsy and epileptic syndromes, not intractable, without status epilepticus: Secondary | ICD-10-CM

## 2014-06-20 MED ORDER — VIMPAT 100 MG PO TABS: ORAL_TABLET | ORAL | Status: DC

## 2014-06-20 MED ORDER — LEVETIRACETAM 500 MG PO TABS: ORAL_TABLET | ORAL | Status: DC

## 2014-06-20 MED ORDER — DIVALPROEX SODIUM ER 500 MG PO TB24: ORAL_TABLET | ORAL | Status: DC

## 2014-06-20 NOTE — Progress Notes (Signed)
Patient: MICHAIAH HOLSOPPLE MRN: [id number] Sex: male DOB: 1985-12-04  Provider: Elveria Rising, NP Location of Care: [facility]  Note type: Routine return visit  History of Present Illness: Referral Source: Dr. Joette Catching History from: his mother Chief Complaint: Seizure Disorder  LONZELL DORRIS is a 28 y.o. young man with history of history of congenital hydrocephalus, VP shunt, cognitive delays, and generalized seizures. He is taking and tolerating generic Depakote, Levetiracetam and Vimpat for his seizure disorder. He was last seen November 19, 2013. His mother reports today that [name] has been doing well since last seen. She said that he had a few more seizures last week because he ran out of Vimpat for a day or so. Overall, however, his seizures have decreased since being on this medication. He has occasional "small seizures" described as staring or staring with some jerks of his limbs with no loss of consciousness about once every other week. He sometimes has a severe headache after this type of seizure.   [name] has been healthy since last seen. 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 He has had problems with mood and irritability but this has improved as well. His mother has no particular concerns today.  Review of Systems: 12 system review was remarkable for seizures  Past Medical History  Diagnosis Date  . Seizures   . Congenital hydrocephalus   . VP (ventriculoperitoneal) shunt status   . Intellectual delay   . Hx MRSA infection    Hospitalizations: No., Head Injury: No., Nervous System Infections: No., Immunizations up to date: Yes.   Past Medical History Comments: see Hx.  Surgical History Past Surgical History  Procedure Laterality Date  . Ventriculoperitoneal shunt      VP shunt,  endoscopic surgery to open up the floor of third ventricle and have it  communicate with the subarachnoid space.     Family History family history includes Cancer in his father, maternal grandfather, and paternal grandfather; Congestive Heart Failure in his maternal grandmother; Diabetes in his other. Family History is otherwise negative for migraines, seizures, cognitive impairment, blindness, deafness, birth defects, chromosomal disorder, autism.  Social History History   Social History  . Marital Status: Single    Spouse Name: N/A    Number of Children: N/A  . Years of Education: N/A   Social History Main Topics  . Smoking status: Passive Smoke Exposure - Never Smoker  . Smokeless tobacco: Never Used  . Alcohol Use: No  . Drug Use: No  . Sexual Activity: No   Other Topics Concern  . None   Social History Narrative  . None   Educational level: 12th grade School Attending:N/A Living with:  mother  Hobbies/Interest: likes watching TV, crossword puzzles, going to church and play on the computer School comments:  Jiovani graduated from [employer] in 2008. He is currently on disability.  Physical Exam BP 126/72  Pulse 86  Ht  (1.499 m)  Wt 116 lb (52.617 kg)  BMI 23.42 kg/m2 General: well developed, well nourished young man with short stature, seated on exam table, in no evident distress  Head: head is large and brachycephalic  Ears, Nose and Throat: poor dentition  Neck: supple with no carotid or supraclavicular bruits.  Respiratory: lungs clear to auscultation  Cardiovascular: regular rate and rhythm, no murmurs  Musculoskeletal: neuromuscular scoliosis - stable  Skin: no rashes or lesions   Neurologic Exam  Mental Status: Awake and fully alert. Oriented to place and time. He had difficulty in answering questions and looked to his mother for help.  Cranial Nerves: Fundoscopic exam reveals sharp disc margins. Pupils equal, briskly reactive to light. Extraocular movements full without nystagmus. Visual fields full to  confrontation. Hearing intact and symmetric to finger rub. Facial sensation intact. Face, tongue, palate move normally and symmetrically. Neck flexion and extension normal.  Motor: Normal bulk and tone. Normal strength in all tested extremity muscles.  Sensory: Refused to answer questions about sensory examination.  Coordination: Rapid alternating movements slightly clumsy but otherwise normal in all extremities. Finger-to-nose and heel-to-shin performed accurately bilaterally. Romberg negative.  Gait and Station: Arises from chair without difficulty. Stance is normal. Gait demonstrates normal stride length and balance. Able to heel, toe, and tandem walk were clumsy but able to do performed without significant difficulty.  Reflexes: Diminished to1+ and symmetric. Toes downgoing.    Assessment and Plan Milas is a 28 year old young man with history of congenital hydrocephalus, VP shunt and generalized seizures. He is taking and tolerating generic Depakote, Levetiracetam and Vimpat for his seizure disorder. He continues to have occasional brief complex partial seizures about twice per month but his mother is satisfied with his seizure control at this time. She has had some problems with getting his medication filled and I talked with her about how to manage that. I will see him back in follow up in 6 months. I asked his mother to call me in the interim if his seizures increased in frequency or severity.

## 2014-06-20 NOTE — Patient Instructions (Signed)
I have refilled Randall Anderson's medications. If you have problems in the future with getting refills, please let me know.   Please let me know if he has increase in his seizures or any other concerns that you may have.   Please plan to return for follow up in 6 months or sooner if needed.

## 2014-06-23 ENCOUNTER — Encounter: Payer: Self-pay | Admitting: Family

## 2014-07-05 ENCOUNTER — Telehealth: Payer: Self-pay | Admitting: Family

## 2014-07-05 ENCOUNTER — Telehealth: Payer: Self-pay | Admitting: *Deleted

## 2014-07-05 ENCOUNTER — Ambulatory Visit (INDEPENDENT_AMBULATORY_CARE_PROVIDER_SITE_OTHER): Payer: Medicaid Other | Admitting: Family

## 2014-07-05 ENCOUNTER — Encounter: Payer: Self-pay | Admitting: Family

## 2014-07-05 VITALS — BP 116/70 | HR 82 | Ht 59.0 in | Wt 116.4 lb

## 2014-07-05 DIAGNOSIS — Z9181 History of falling: Secondary | ICD-10-CM

## 2014-07-05 DIAGNOSIS — G40309 Generalized idiopathic epilepsy and epileptic syndromes, not intractable, without status epilepticus: Secondary | ICD-10-CM

## 2014-07-05 DIAGNOSIS — G911 Obstructive hydrocephalus: Secondary | ICD-10-CM

## 2014-07-05 DIAGNOSIS — R296 Repeated falls: Secondary | ICD-10-CM

## 2014-07-05 NOTE — Telephone Encounter (Signed)
I called and talked to Mom. She said that Randall Anderson started falling about 2 weeks ago. He tends to fall backwards without warning. He does not seem to be fainting or having a seizure, just falls. She said that with his most recent fall, his leg is very bruised and painful. I asked Mom to bring him in for me to assess him. She agreed and I added him on to my schedule for 12 today. TG

## 2014-07-05 NOTE — Telephone Encounter (Signed)
Thank you, after you see him please come get me so that I can assess him with you.

## 2014-07-05 NOTE — Telephone Encounter (Signed)
I reviewed the CT scan and compared it with the CT scan and affect.  There is no difference area at it shows massive dilatation of the lateral and third ventricles and a fairly normal fourth ventricle.  The patient has a ventriculostomy.  It would appear that his condition is stable and not the cause of his unsteady gait.  I reviewed your note and agree with this plan.

## 2014-07-05 NOTE — Telephone Encounter (Signed)
[name], mom, stated the pt has been falling down 2-3 times a day. She said that the pt's right leg is bruised. The mother said the pt hit his head today when he fell. She does not know if the pt hit his head before today when the mother is not home. The mother said she has applied muscle cream and giving him warm baths with epsom salt. She said this has been happening for two weeks. The mother can be reached at [phone].

## 2014-07-05 NOTE — Progress Notes (Signed)
Patient: Randall Anderson MRN: [id number] Sex: male DOB: 12-25-1985  Provider: Elveria Rising, NP Location of Care: [facility]  Note type: Urgent return visit  History of Present Illness: Referral Source: Dr. Joette Catching History from: patient and his mother Chief Complaint: Frequent Falls   Randall Anderson is a 28 y.o. young man with history of congenital hydrocephalus, VP shunt, cognitive delays, and generalized seizures. He is taking and tolerating generic Depakote, Levetiracetam and Vimpat for his seizure disorder. He was last seen June 20, 2014. He returns today for urgent revisit because his mother called today to report that for about the last 10 days he has been falling frequently. I asked her to bring him in for evaluation. Mom said that he has not been ill and has not had seizures. She said that he seems to lose balance and falls. [name] says that he tends to fall backward or to his side without warning. He denies feeling dizzy. He has a resolving large bruise on his right lower rib cage as well as a very large resolving bruise that extends from his right hip almost to mid thigh. He said that he fell on that side, then fell again. He complains of considerable pain and soreness in the hip and leg when walking as well as to touch when the bruised area is palpated.    Review of Systems: 12 system review was remarkable for gait problems   Past Medical History  Diagnosis Date  . Seizures   . Congenital hydrocephalus   . VP (ventriculoperitoneal) shunt status   . Intellectual delay   . Hx MRSA infection    Hospitalizations: No., Head Injury: No., Nervous System Infections: No., Immunizations up to date: Yes.   Past Medical History Comments: See hx.  Surgical History Past Surgical History  Procedure Laterality Date  . Ventriculoperitoneal shunt      VP shunt,  endoscopic surgery to open up the floor of third ventricle and have it communicate with the  subarachnoid space.     Family History family history includes Cancer in his father, maternal grandfather, and paternal grandfather; Congestive Heart Failure in his maternal grandmother; Diabetes in his other. Family History is otherwise negative for migraines, seizures, cognitive impairment, blindness, deafness, birth defects, chromosomal disorder, autism.  Social History History   Social History  . Marital Status: Single    Spouse Name: N/A    Number of Children: N/A  . Years of Education: N/A   Social History Main Topics  . Smoking status: Passive Smoke Exposure - Never Smoker  . Smokeless tobacco: Never Used  . Alcohol Use: No  . Drug Use: No  . Sexual Activity: No   Other Topics Concern  . None   Social History Narrative  . None   Educational level: 12th grade School Attending: Living with:  mother and sister  Hobbies/Interest: Enjoys going to church, watching TV, puzzles and playing games on the computer. School comments:  Akif graduated from [employer] in 2008.   Physical Exam BP 116/70  Pulse 82  Ht  (1.499 m)  Wt 116 lb 6.4 oz (52.799 kg)  BMI 23.50 kg/m2 General: well developed, well nourished young man with short stature, seated in a wheel chair, in no evident distress  Head: head is large and brachycephalic  Ears, Nose and Throat: poor dentition  Neck: supple with no carotid or supraclavicular bruits.  Respiratory: lungs clear to auscultation  Cardiovascular: regular rate and rhythm, no  murmurs  Musculoskeletal: neuromuscular scoliosis - stable  Skin: no rashes or lesions. He has a large bruise on his right lower rib cage as well as a large bruise that extends from his right hip to mid thigh  Neurologic Exam  Mental Status: Awake and fully alert. Oriented to place and time. He had difficulty in answering questions and looked to his mother for help.  Cranial Nerves: Fundoscopic exam reveals sharp disc margins. Pupils equal, briskly  reactive to light. Extraocular movements full without nystagmus. Visual fields full to confrontation. Hearing intact and symmetric to finger rub. Facial sensation intact. Face, tongue, palate move normally and symmetrically. Neck flexion and extension normal.  Motor: Normal bulk and tone. Normal strength in all tested extremity muscles.  Sensory: Refused to answer questions about sensory examination.  Coordination: Rapid alternating movements slightly clumsy but otherwise normal in all extremities. Finger-to-nose and heel-to-shin performed accurately bilaterally. Romberg negative.  Gait and Station: Arises from chair with some difficulty due to pain in his right hip. Stance is normal. Gait demonstrates shortened stride length, wide based gait and limping on the right. His arms were up in a guard position and he reported that he was fearful of falling. Reflexes: Diminished to1+ and symmetric. Toes downgoing.   Assessment and Plan Deylan is a 28 year old young man with history of congenital hydrocephalus, VP shunt and generalized seizures. He is taking and tolerating generic Depakote, Levetiracetam and Vimpat for his seizure disorder. He was last seen June 20, 2014, but returns on urgent basis today because his mother called to report that he has been falling frequently for about the last 10 days. He has bruising on his right rib cage and right hip and outer thigh. [name]'s gait is now broad based and tentative, shortened stride length. The remainder of his examination is unchanged. Dr Sharene Skeans was consulted and came in to see [name] and talk to his mother. He repeated his eye, ear and gait examination. We will send [name] to have a CT scan of the brain this afternoon and I will call his mother when the results are available. [name] is alert and does not appear to be impaired from his medications. He is not having seizures or syncope. It is not clear what is the etiology of his falls. We talked with him and his  mother about safety at home while he is having problems with balance and falls. His mother says that her home is too small for an assistive device such as a walker. His mother says that she will be with him this weekend. I will see him back in 3-4 weeks to see how he is doing but will be touch with his mother by phone in the interim.

## 2014-07-05 NOTE — Telephone Encounter (Signed)
I called Mom to let her know that Dr Sharene Skeans had reviewed the CT scan of the head that was done today and that the images are unchanged from the prior study done in 2011. I asked her to call me on Monday and let me know how Randall Anderson was doing and she agreed. She knows to take him to ER if his condition worsens and that we have a doctor on call at night and on weekend if needed. She plans to monitor his walking closely this weekend. TG

## 2014-07-06 ENCOUNTER — Encounter: Payer: Self-pay | Admitting: Family

## 2014-07-07 DIAGNOSIS — R296 Repeated falls: Secondary | ICD-10-CM | POA: Insufficient documentation

## 2014-07-07 NOTE — Patient Instructions (Signed)
We will perform a CT scan this afternoon and I will call you with instructions after it has been read today.   Randall Anderson needs to have someone with him when he walks or be able to hold on to furniture or walls in the home to help keep him from falling until we can determine what is causing him to have problems with his balance.   I will see Randall Anderson back in follow up on Oct 16th or sooner if needed.

## 2014-07-26 ENCOUNTER — Ambulatory Visit (INDEPENDENT_AMBULATORY_CARE_PROVIDER_SITE_OTHER): Payer: Medicaid Other | Admitting: Family

## 2014-07-26 ENCOUNTER — Encounter: Payer: Self-pay | Admitting: Family

## 2014-07-26 VITALS — BP 108/70 | HR 78 | Ht 59.75 in | Wt 115.8 lb

## 2014-07-26 DIAGNOSIS — R404 Transient alteration of awareness: Secondary | ICD-10-CM

## 2014-07-26 DIAGNOSIS — Z79899 Other long term (current) drug therapy: Secondary | ICD-10-CM

## 2014-07-26 DIAGNOSIS — G911 Obstructive hydrocephalus: Secondary | ICD-10-CM

## 2014-07-26 DIAGNOSIS — R296 Repeated falls: Secondary | ICD-10-CM

## 2014-07-26 DIAGNOSIS — G40309 Generalized idiopathic epilepsy and epileptic syndromes, not intractable, without status epilepticus: Secondary | ICD-10-CM

## 2014-07-26 NOTE — Progress Notes (Signed)
Patient: Randall GivensGregory H Anderson MRN: [id number] Sex: male DOB: [birth year]  Provider: Elveria RisingGOODPASTURE, Vania Rosero, NP [facility]  Note type: Routine return visit  History of Present Illness: Referral Source: Dr. Joette CatchingLeonard Nyland History from: patient and his mother Chief Complaint: Falls  Randall GivensGregory H Exley is a 28 y.o. young man with history of history of congenital hydrocephalus, VP shunt, cognitive delays, and generalized seizures. He is taking and tolerating generic Depakote, Levetiracetam and Vimpat for his seizure disorder. He was last seen July 05, 2014. He returns today for follow up because when he was last seen, he had been falling frequently for the 10 days prior. He had bruising on his right rib cage and right hip and outer thigh. Randall Anderson's gait was broad based and tentative, shortened stride length. I sent him for an urgent CT scan of the head that day that revealed massive dilatation of the lateral and third ventricles and a fairly normal fourth ventricle with a ventriculostomy. The study was unchanged from prior studies. We made no changes in his treatment plan but asked his mother to monitor him closely and contact the office for changes in his condition. Today Randall Anderson's gait has returned to normal. Mom said that he has fallen 4 times since he was last seen. She has been monitoring him closely and says that she has noted that he has events in the early morning when giving his medication. She awakens him at 6AM, and while giving medication, says that he tends to stare then lose posture. She says the episodes are brief, and that she has been busy trying to prevent injury, so that she has not noted if he has change in color or other symptoms. Mom said that after the episodes he recovers quickly and seems to be back to his usual baseline.  [name] has not missed any doses of medication, and his generic medications have not changed in appearance or manufacturer.  He has been  otherwise healthy since last seen. His only complaint today is of some aching pain in his right foot.   Review of Systems: 12 system review was remarkable for falls  Past Medical History  Diagnosis Date  . Seizures   . Congenital hydrocephalus   . VP (ventriculoperitoneal) shunt status   . Intellectual delay   . Hx MRSA infection    Hospitalizations: No., Head Injury: No., Nervous System Infections: No., Immunizations up to date: Yes.   Past Medical History Comments: see Hx.  Surgical History Past Surgical History  Procedure Laterality Date  . Ventriculoperitoneal shunt      VP shunt,  endoscopic surgery to open up the floor of third ventricle and have it communicate with the subarachnoid space.     Family History family history includes Cancer in his father, maternal grandfather, and paternal grandfather; Congestive Heart Failure in his maternal grandmother; Diabetes in his other. Family History is otherwise negative for migraines, seizures, cognitive impairment, blindness, deafness, birth defects, chromosomal disorder, autism.  Social History History   Social History  . Marital Status: Single    Spouse Name: N/A    Number of Children: N/A  . Years of Education: N/A   Social History Main Topics  . Smoking status: Passive Smoke Exposure - Never Smoker  . Smokeless tobacco: Never Used  . Alcohol Use: No  . Drug Use: No  . Sexual Activity: No   Other Topics Concern  . None   Social History Narrative  . None   Educational  level: 12th grade School Attending:N/A Living with:  mother  Hobbies/Interest: watching TV, doing puzzles and being on the computer School comments:  Randall Anderson is currently living at home. He graduated from [employer] in 2008.  Physical Exam BP 108/70  Pulse 78  Ht 4' 11.75" (1.518 m)  Wt 115 lb 12.8 oz (52.527 kg)  BMI 22.79 kg/m2 General: well developed, well nourished young man with short stature, in no evident distress  Head:  head is large and brachycephalic  Ears, Nose and Throat: poor dentition  Neck: supple with no carotid or supraclavicular bruits.  Respiratory: lungs clear to auscultation  Cardiovascular: regular rate and rhythm, no murmurs  Musculoskeletal: neuromuscular scoliosis - stable  Skin: no rashes or lesions.  Neurologic Exam  Mental Status: Awake and fully alert. Oriented to place and time. He had difficulty in answering questions and looked to his mother for help.  Cranial Nerves: Fundoscopic exam reveals sharp disc margins. Pupils equal, briskly reactive to light. Extraocular movements full without nystagmus. Visual fields full to confrontation. Hearing intact and symmetric to finger rub. Facial sensation intact. Face, tongue, palate move normally and symmetrically. Neck flexion and extension normal.  Motor: Normal bulk and tone. Normal strength in all tested extremity muscles.  Sensory: Refused to answer questions about sensory examination.  Coordination: Rapid alternating movements slightly clumsy but otherwise normal in all extremities. Finger-to-nose and heel-to-shin performed accurately bilaterally. Romberg negative.  Gait and Station: Arises from chair without difficulty. Stance is normal. Gait is normal. He tends to walk quickly, slightly on his toes, but is otherwise normal. Reflexes: Diminished and symmetric. Toes downgoing.    Assessment and Plan Randall Anderson is a 28 year old young man with history of congenital hydrocephalus, VP shunt and generalized seizures. He is taking and tolerating generic Depakote, Levetiracetam and Vimpat for his seizure disorder. He was last seen July 05, 2014 for frequent falls. He underwent a CT scan of the brain that was essentially unchanged from previous studies. Today his gait has returned to normal. His mother has noted that he has fallen 4 times since he was seen, each time in the early morning, while taking his morning medication. It is not clear from the  description if he is having a seizure, possibly a drop attack, or if he has experiencing a syncopal episode. Dr Sharene SkeansHickling was consulted and came in to talk with his mother. He asked her to have someone in the home attempt to video the episode if it occurs again, and to put her head or hand over Randall Anderson's chest if it occurs again to see if his heart rate is very fast or very slow. This will give more information as to what may be occurring with these episodes. I asked his mother to call me after the next time it occurs and let me know what happens. He will continue his medication as usual for now and will return for follow up in 1 month or sooner if needed. Mom agreed with these plans.

## 2014-07-27 ENCOUNTER — Encounter: Payer: Self-pay | Admitting: Family

## 2014-07-27 DIAGNOSIS — R404 Transient alteration of awareness: Secondary | ICD-10-CM | POA: Insufficient documentation

## 2014-07-27 NOTE — Patient Instructions (Signed)
If [name] has another fall or seizure, and someone is in the home that can video the episode, please try to have them video it so that we can see what is happening.   If he has another fall or seizure, put your head on his chest and see if you can count his heart rate. We would like to know if it is going very fast or very slow. Please call me and let me know what you find out.   He should continue his medicines as usual for now.   Please plan to return for follow up in 1 month or sooner if needed.

## 2014-09-02 ENCOUNTER — Ambulatory Visit: Payer: Medicaid Other | Admitting: Family

## 2014-11-29 ENCOUNTER — Encounter: Payer: Self-pay | Admitting: Family

## 2014-11-29 ENCOUNTER — Ambulatory Visit (INDEPENDENT_AMBULATORY_CARE_PROVIDER_SITE_OTHER): Payer: Medicaid Other | Admitting: Family

## 2014-11-29 VITALS — BP 110/74 | HR 80 | Ht 59.0 in | Wt 130.4 lb

## 2014-11-29 DIAGNOSIS — G40309 Generalized idiopathic epilepsy and epileptic syndromes, not intractable, without status epilepticus: Secondary | ICD-10-CM

## 2014-11-29 DIAGNOSIS — G911 Obstructive hydrocephalus: Secondary | ICD-10-CM

## 2014-11-29 DIAGNOSIS — R296 Repeated falls: Secondary | ICD-10-CM

## 2014-11-29 DIAGNOSIS — Z79899 Other long term (current) drug therapy: Secondary | ICD-10-CM

## 2014-11-29 MED ORDER — DIVALPROEX SODIUM ER 500 MG PO TB24: ORAL_TABLET | ORAL | Status: DC

## 2014-11-29 MED ORDER — VIMPAT 100 MG PO TABS: ORAL_TABLET | ORAL | Status: DC

## 2014-11-29 MED ORDER — LEVETIRACETAM 500 MG PO TABS: ORAL_TABLET | ORAL | Status: DC

## 2014-11-29 NOTE — Patient Instructions (Signed)
Continue [name]'s medication has you have been giving it. Let me know if you have more problems at the pharmacy getting his medication filled.  Also let me know if he has more seizures or if the seizures worsen.  Please plan to return for follow up in 6 months or sooner if needed.

## 2014-11-29 NOTE — Progress Notes (Signed)
Patient: Randall Anderson MRN: [id number] Sex: male DOB: 1985-10-14  Provider: Elveria RisingGOODPASTURE, Paula Zietz, NP [facility]  Note type: Routine return visit  History of Present Illness: Referral Source: Dr. Joette CatchingLeonard Nyland History from: his mother Chief Complaint: Congenital Hydrocephalus/VP Shunt/Seizures   Randall Anderson is a 29 y.o. young man with history of congenital hydrocephalus, VP shunt, cognitive delays, and generalized seizures. He is taking and tolerating generic Depakote, Levetiracetam and Vimpat for his seizure disorder. He was last seen July 26, 2014. When he was last seen, he had been falling frequently. He had CT scan of the head at that time that was unchanged from prior studies. His mother reports today that the falling episodes gradually diminished in frequency and then stopped. She says that he has had fewer seizures as well. His last seizure occurred several weeks ago. Mom is concerned that he will have a seizure soon, because she has been having trouble getting his Vimpat filled. She said that the pharmacy would not order it until it was due, which made him have a couple of days without the medication. She is thinking of switching pharmacies.   Randall Anderson has been otherwise healthy since last seen. He is calm and in a good mood day. He has had episodes of angry mood but Mom says that overall his emotions have been stable since he was last seen.  Review of Systems: 12 system review was unremarkable  Past Medical History  Diagnosis Date  . Seizures   . Congenital hydrocephalus   . VP (ventriculoperitoneal) shunt status   . Intellectual delay   . Hx MRSA infection    Hospitalizations: No., Head Injury: No., Nervous System Infections: No., Immunizations up to date: Yes.   Past Medical History Comments: see hx.   Surgical History Past Surgical History  Procedure Laterality Date  . Ventriculoperitoneal shunt      VP shunt,  endoscopic surgery  to open up the floor of third ventricle and have it communicate with the subarachnoid space.   . Circumcision  1989    Family History family history includes Cancer in his father, maternal grandfather, and paternal grandfather; Congestive Heart Failure in his maternal grandmother; Diabetes in his other. Family History is otherwise negative for migraines, seizures, cognitive impairment, blindness, deafness, birth defects, chromosomal disorder, autism.  Social History History   Social History  . Marital Status: Single    Spouse Name: N/A  . Number of Children: N/A  . Years of Education: N/A   Social History Main Topics  . Smoking status: Passive Smoke Exposure - Never Smoker  . Smokeless tobacco: Never Used     Comment: Mom smokes   . Alcohol Use: No  . Drug Use: No  . Sexual Activity: No   Other Topics Concern  . None   Social History Narrative   Educational level: 12th grade School Attending:  N/A Living with:  mother  Hobbies/Interest: Enjoys watching TV, being on his computer, word search and reading his bible. School comments:  [name] graduated from [employer] in 2008.  Physical Exam BP 110/74 mmHg  Pulse 80  Ht 4\' 11"  (1.499 m)  Wt 130 lb 6.4 oz (59.149 kg)  BMI 26.32 kg/m2 General: well developed, well nourished young man with short stature, in no evident distress  Head: head is large and brachycephalic  Ears, Nose and Throat: poor dentition  Neck: supple with no carotid or supraclavicular bruits.  Respiratory: lungs clear to auscultation  Cardiovascular: regular rate and rhythm, no murmurs  Musculoskeletal: neuromuscular scoliosis - stable  Skin: no rashes or lesions.  Neurologic Exam  Mental Status: Awake and fully alert. Oriented to place and time. He had difficulty in answering questions and looked to his mother for help.  Cranial Nerves: Fundoscopic exam reveals sharp disc margins. Pupils equal, briskly reactive to light. Extraocular  movements full without nystagmus. Visual fields full to confrontation. Hearing intact and symmetric to finger rub. Facial sensation intact. Face, tongue, palate move normally and symmetrically. Neck flexion and extension normal.  Motor: Normal bulk and tone. Normal strength in all tested extremity muscles.  Sensory: Refused to answer questions about sensory examination.  Coordination: Rapid alternating movements slightly clumsy but otherwise normal in all extremities. Finger-to-nose and heel-to-shin performed accurately bilaterally. Romberg negative.  Gait and Station: Arises from chair without difficulty. Stance is normal. Gait is normal. He tends to walk quickly, slightly on his toes, but is otherwise normal. Reflexes: Diminished and symmetric. Toes downgoing.    Assessment and Plan Dayan is a 29 year old young man with history of congenital hydrocephalus, VP shunt and generalized seizures. He is taking and tolerating generic Depakote, Levetiracetam and Vimpat for his seizure disorder. When he was last seen in October 2015, he was experiencing frequent falls, but that has fortunately resolved. He has had fewer seizures and has been doing well. He has been out of Vimpat since yesterday because of problems at the pharmacy. I gave him some samples today, and asked Mom to let me know if she was unable to fill the prescription today after she leaves the office. Randall Sours will continue taking his medications as he has been taking them, and return for follow up in 6 months or sooner if needed. Mom agreed with these plans.

## 2015-02-23 ENCOUNTER — Other Ambulatory Visit: Payer: Self-pay | Admitting: Family

## 2015-03-01 ENCOUNTER — Other Ambulatory Visit: Payer: Self-pay | Admitting: Family

## 2015-06-08 ENCOUNTER — Other Ambulatory Visit: Payer: Self-pay | Admitting: Family

## 2015-06-17 ENCOUNTER — Ambulatory Visit: Payer: Medicaid Other | Admitting: Family

## 2015-06-30 ENCOUNTER — Encounter: Payer: Self-pay | Admitting: Family

## 2015-06-30 ENCOUNTER — Ambulatory Visit (INDEPENDENT_AMBULATORY_CARE_PROVIDER_SITE_OTHER): Payer: Medicaid Other | Admitting: Family

## 2015-06-30 VITALS — BP 108/70 | HR 84 | Ht 59.0 in | Wt 144.8 lb

## 2015-06-30 DIAGNOSIS — R296 Repeated falls: Secondary | ICD-10-CM

## 2015-06-30 DIAGNOSIS — G911 Obstructive hydrocephalus: Secondary | ICD-10-CM

## 2015-06-30 DIAGNOSIS — F819 Developmental disorder of scholastic skills, unspecified: Secondary | ICD-10-CM

## 2015-06-30 DIAGNOSIS — G40309 Generalized idiopathic epilepsy and epileptic syndromes, not intractable, without status epilepticus: Secondary | ICD-10-CM | POA: Diagnosis not present

## 2015-06-30 DIAGNOSIS — G44219 Episodic tension-type headache, not intractable: Secondary | ICD-10-CM | POA: Diagnosis not present

## 2015-06-30 MED ORDER — LEVETIRACETAM 500 MG PO TABS: ORAL_TABLET | ORAL | Status: DC

## 2015-06-30 MED ORDER — VIMPAT 50 MG PO TABS: ORAL_TABLET | ORAL | Status: DC

## 2015-06-30 MED ORDER — VIMPAT 100 MG PO TABS: ORAL_TABLET | ORAL | Status: DC

## 2015-06-30 MED ORDER — DIVALPROEX SODIUM ER 500 MG PO TB24: 500.0000 mg | ORAL_TABLET | Freq: Three times a day (TID) | ORAL | Status: DC

## 2015-06-30 NOTE — Progress Notes (Signed)
Patient: Randall Anderson MRN: [id number] Sex: male DOB: [birth year]  Provider: Elveria Rising, NP Location of Care: [facility]  Note type: Routine return visit  History of Present Illness: Referral Source: Joette Catching, MD History from: mother, patient and [facility] chart Chief Complaint: Congenital Hydrocephalus/VP Shunt/Epilepsy  Randall Anderson is a 29 y.o. young man history of congenital hydrocephalus, VP shunt, cognitive delays, headaches, episodes of angry mood and generalized seizures. He is taking and tolerating generic Depakote, Levetiracetam and Vimpat for his seizure disorder. Randall Anderson was last seen November 29, 2014. His mother reports today that says Randall Anderson has had brief intermittent seizures since he was last seen. His mood has been better and he has experienced rare headaches. He has been generally healthy. His mother's only concern was to ask if I would change his Vimpat prescription. She has difficulty splitting the  and wants him to have a  tablet in addition to the  tablet. Neither Mother nor Randall Anderson have other health concerns today other than previously mentioned.  Review of Systems: Please see the HPI for neurologic and other pertinent review of systems. Otherwise, the following systems are noncontributory including constitutional, eyes, ears, nose and throat, cardiovascular, respiratory, gastrointestinal, genitourinary, musculoskeletal, skin, endocrine, hematologic/lymph, allergic/immunologic and psychiatric.   Past Medical History  Diagnosis Date  . Seizures   . Congenital hydrocephalus   . VP (ventriculoperitoneal) shunt status   . Intellectual delay   . Hx MRSA infection    Hospitalizations: No., Head Injury: No., Nervous System Infections: No., Immunizations up to date: Yes.   Past Medical History Comments: In October 2015, Randall Anderson was falling frequently. He had CT scan of the head at that time that was unchanged from prior studies. The  falling episodes gradually diminished and resolved without intervention. Surgical History Past Surgical History  Procedure Laterality Date  . Ventriculoperitoneal shunt      VP shunt,  endoscopic surgery to open up the floor of third ventricle and have it communicate with the subarachnoid space.   . Circumcision  1989    Family History family history includes Cancer in his father, maternal grandfather, and paternal grandfather; Congestive Heart Failure in his maternal grandmother; Diabetes in his other. Family History is otherwise negative for migraines, seizures, cognitive impairment, blindness, deafness, birth defects, chromosomal disorder, autism.  Social History Social History   Social History  . Marital Status: Single    Spouse Name: N/A  . Number of Children: N/A  . Years of Education: N/A   Social History Main Topics  . Smoking status: Passive Smoke Exposure - Never Smoker  . Smokeless tobacco: Never Used     Comment: Mom smokes   . Alcohol Use: No  . Drug Use: No  . Sexual Activity: No   Other Topics Concern  . None   Social History Narrative   Arcenio graduated from high school and is not currently employed or enrolled in a day program.   Rachael lives with his mother.    Tanush enjoys washing dishes, vacuuming, playing on the computer, and going to church.   Allergies Allergies  Allergen Reactions  . Other Anaphylaxis    Shell Fish    Physical Exam BP 108/70 mmHg  Pulse 84  Ht  (1.499 m)  Wt 144 lb 12.8 oz (65.681 kg)  BMI 29.23 kg/m2 General: well developed, well nourished young man with short stature, in no evident distress  Head: head is large and brachycephalic  Ears, Nose and Throat:  poor dentition  Neck: supple with no carotid or supraclavicular bruits.  Respiratory: lungs clear to auscultation  Cardiovascular: regular rate and rhythm, no murmurs  Musculoskeletal: neuromuscular scoliosis - stable  Skin: no rashes or  lesions.  Neurologic Exam  Mental Status: Awake and fully alert. Oriented to place and time. He had difficulty in answering questions and looked to his mother for help.  Cranial Nerves: Fundoscopic exam reveals sharp disc margins. Pupils equal, briskly reactive to light. Extraocular movements full without nystagmus. Visual fields full to confrontation. Hearing intact and symmetric to finger rub. Facial sensation intact. Face, tongue, palate move normally and symmetrically. Neck flexion and extension normal.  Motor: Normal bulk and tone. Normal strength in all tested extremity muscles.  Sensory: Refused to answer questions about sensory examination.  Coordination: Rapid alternating movements slightly clumsy but otherwise normal in all extremities. Finger-to-nose and heel-to-shin performed accurately bilaterally. Romberg negative.  Gait and Station: Arises from chair without difficulty. Stance is normal. Gait is normal. He tends to walk quickly, slightly on his toes, but is otherwise normal. Reflexes: Diminished and symmetric. Toes downgoing.   Impression 1. Generalized seizure disorder 2. History of congential hydrocephalus with VP shunt 3. History of headaches 4. History of episodes of angry mood 5. Cognitive delay   Recommendations for plan of care The patient's previous [facility] records were reviewed. Kvion has neither had nor required imaging since the last visit. He had lab studies drawn by his PCP that revealed a normal CBC and Basic Metabolic Panel, and a non-trough Depakote level of 176mcg/ml. I reviewed these results with Randall Anderson and his mother. Randall Anderson is a 29 year old young man with history of congenital hydrocephalus, VP shunt and generalized seizures. He is taking and tolerating generic Depakote, Levetiracetam and Vimpat for his seizure disorder. He has been doing well since last seen. I will change his Vimpat prescriptions to be Vimpat  twice per day and Vimpat  once per day  at bedtime as his mother requested. I will make no other changes in his treatment regimen Randall Anderson will return for follow up in 6 months or sooner if needed. Mom agreed with these plans  The medication list was reviewed and reconciled.  I reviewed changes that were made in the prescribed medications today.  A complete medication list was provided to the patient and his mother.  Total time spent with the patient was 20 minutes, of which 50% or more was spent in counseling and coordination of care.

## 2015-06-30 NOTE — Patient Instructions (Signed)
I have changed the Vimpat prescription so that you will get  and  tablets as you requested. I have refilled all of the seizure medications today. Let me know if you have any problems filling the medications.   Also please let me know if [name] has any increase in the number of seizures that he has or if the seizures last longer or change in other ways.  Otherwise, please plan to return for follow up in 6 months or sooner if needed.

## 2015-07-01 DIAGNOSIS — F819 Developmental disorder of scholastic skills, unspecified: Secondary | ICD-10-CM | POA: Insufficient documentation

## 2015-09-10 DIAGNOSIS — Z0279 Encounter for issue of other medical certificate: Secondary | ICD-10-CM

## 2015-12-29 ENCOUNTER — Ambulatory Visit (INDEPENDENT_AMBULATORY_CARE_PROVIDER_SITE_OTHER): Payer: Medicaid Other | Admitting: Family

## 2015-12-29 ENCOUNTER — Encounter: Payer: Self-pay | Admitting: Family

## 2015-12-29 VITALS — BP 110/76 | HR 82 | Ht 59.5 in | Wt 142.0 lb

## 2015-12-29 DIAGNOSIS — F819 Developmental disorder of scholastic skills, unspecified: Secondary | ICD-10-CM

## 2015-12-29 DIAGNOSIS — F39 Unspecified mood [affective] disorder: Secondary | ICD-10-CM

## 2015-12-29 DIAGNOSIS — G44219 Episodic tension-type headache, not intractable: Secondary | ICD-10-CM

## 2015-12-29 DIAGNOSIS — G911 Obstructive hydrocephalus: Secondary | ICD-10-CM | POA: Diagnosis not present

## 2015-12-29 DIAGNOSIS — G43009 Migraine without aura, not intractable, without status migrainosus: Secondary | ICD-10-CM

## 2015-12-29 DIAGNOSIS — G40309 Generalized idiopathic epilepsy and epileptic syndromes, not intractable, without status epilepticus: Secondary | ICD-10-CM | POA: Diagnosis not present

## 2015-12-29 MED ORDER — PROPRANOLOL HCL 10 MG PO TABS: ORAL_TABLET | ORAL | Status: DC

## 2015-12-29 MED ORDER — MELATONIN 3 MG PO TABS: ORAL_TABLET | ORAL | Status: DC

## 2015-12-29 NOTE — Patient Instructions (Addendum)
For [name]'s headaches, we will try Propranolol 10mg . Give it to him as follows: Give 1 tablet at bedtime for 1 week, then give him 2 tablets at bedtime after that.   Keep track of the headaches and let me know if they do not improve.   For Melatonin, start by giving him one 3 mg tablet at supper for 1 week, then give him 2 tablets at supper if needed to help him to get to sleep on time.   Randall Anderson should continue his seizure medicines as he has been taking them.  Please plan to return for follow up in 6 months or sooner if needed.

## 2015-12-29 NOTE — Progress Notes (Signed)
Patient: Randall GivensGregory H Anderson MRN: [id number] Sex: male DOB: 05-Jul-1986  Provider: Elveria Risingina Gershom Brobeck, NP [facility]  Note type: Routine return visit  History of Present Illness: Referral Source: Dr. Joette CatchingLeonard Nyland History from: patient, referring office, [facility] chart and mother Chief Complaint: Epilepsy  Randall GivensGregory H Bourdeau is a 30 y.o. man with history of congenital hydrocephalus, VP shunt, cognitive delays, headaches, episodes of angry mood and generalized seizures. He is taking and tolerating generic Depakote, Levetiracetam and Vimpat for his seizure disorder. Randall SoursGreg was last seen June 30, 2015. His mother reports today that says Randall SoursGreg has had a few brief seizures since he was last seen. Randall SoursGreg complains of frequent severe headaches and asks if there is something to help with that. He says that he has some kind of headache every day, but that several days per week he has a severe headache that requires that he take Tylenol, stop activities and sleep to obtain relief. With these headaches he reports holocephalic pain, intolerance to light and noise, and anorexia. He says that he has to sleep a couple of hours in order to obtain relief. Mom believes that he has headaches because he has difficulty going to sleep and staying asleep at night and says that he has never slept well.  Mom says that Randall SoursGreg drinks adequate liquids and does not skip meals.   Neither Mother nor Randall SoursGreg have other health concerns today other than previously mentioned  Review of Systems: Please see the HPI for neurologic and other pertinent review of systems. Otherwise, the following systems are noncontributory including constitutional, eyes, ears, nose and throat, cardiovascular, respiratory, gastrointestinal, genitourinary, musculoskeletal, skin, endocrine, hematologic/lymph, allergic/immunologic and psychiatric.   Past Medical History  Diagnosis Date  . Seizures (HCC)   . Congenital  hydrocephalus (HCC)   . VP (ventriculoperitoneal) shunt status   . Intellectual delay   . Hx MRSA infection    Hospitalizations: No., Head Injury: No., Nervous System Infections: No., Immunizations up to date: Yes.   Past Medical History Comments: In October 2015, Randall SoursGreg was falling frequently. He had CT scan of the head at that time that was unchanged from prior studies. The falling episodes gradually diminished and resolved without intervention.  Surgical History Past Surgical History  Procedure Laterality Date  . Ventriculoperitoneal shunt      VP shunt,  endoscopic surgery to open up the floor of third ventricle and have it communicate with the subarachnoid space.   . Circumcision  1989    Family History family history includes Cancer in his father, maternal grandfather, and paternal grandfather; Congestive Heart Failure in his maternal grandmother; Diabetes in his other. Family History is otherwise negative for migraines, seizures, cognitive impairment, blindness, deafness, birth defects, chromosomal disorder, autism.  Social History Social History   Social History  . Marital Status: Single    Spouse Name: N/A  . Number of Children: N/A  . Years of Education: N/A   Social History Main Topics  . Smoking status: Passive Smoke Exposure - Never Smoker  . Smokeless tobacco: Never Used     Comment: Mom smokes   . Alcohol Use: No  . Drug Use: No  . Sexual Activity: No   Other Topics Concern  . None   Social History Narrative   Randall Anderson graduated from [employer] in 2008. He is not currently employed or enrolled in a day program. Randall Anderson enjoys washing dishes, vacuuming, playing on the computer, and going to church.  Amron lives with his mother. He has two older siblings that do not reside in the home.        Allergies Allergies  Allergen Reactions  . Other Anaphylaxis    Shell Fish    Physical Exam BP 110/76 mmHg  Pulse 82  Ht 4' 11.5" (1.511 m)  Wt  142 lb (64.411 kg)  BMI 28.21 kg/m2 General: well developed, well nourished young man with short stature, in no evident distress  Head: head is large and brachycephalic  Ears, Nose and Throat: poor dentition  Neck: supple with no carotid or supraclavicular bruits.  Respiratory: lungs clear to auscultation  Cardiovascular: regular rate and rhythm, no murmurs  Musculoskeletal: neuromuscular scoliosis - stable  Skin: no rashes or lesions.  Neurologic Exam  Mental Status: Awake and fully alert. Oriented to place and time. He had difficulty in answering questions and looked to his mother for help.  Cranial Nerves: Fundoscopic exam reveals sharp disc margins. Pupils equal, briskly reactive to light. Extraocular movements full without nystagmus. Visual fields full to confrontation. Hearing intact and symmetric to finger rub. Facial sensation intact. Face, tongue, palate move normally and symmetrically. Neck flexion and extension normal.  Motor: Normal bulk and tone. Normal strength in all tested extremity muscles.  Sensory: Refused to answer questions about sensory examination.  Coordination: Rapid alternating movements slightly clumsy but otherwise normal in all extremities. Finger-to-nose and heel-to-shin performed accurately bilaterally. Romberg negative.  Gait and Station: Arises from chair without difficulty. Stance is normal. Gait is normal. He tends to walk quickly, slightly on his toes, but is otherwise normal. Reflexes: Diminished and symmetric. Toes downgoing.    Impression 1. Generalized seizure disorder 2. History of congential hydrocephalus with VP shunt 3. Migraine without aura 4. Chronic tension headaches 5. Insufficient sleep pattern 6. History of episodes of angry mood 7. Cognitive delay  Recommendations for plan of care The patient's previous [facility] records were reviewed. Ethen has neither had nor required imaging or lab studies since the last visit. He is a 30  year old young man with history of congenital hydrocephalus, VP shunt and generalized seizures. He is taking and tolerating generic Depakote, Levetiracetam and Vimpat for his seizure disorder. He continues to have intermittent brief seizures that are intractable. Randall Anderson is also experiencing headaches, a combination of what is likely migraine without aura and tension headaches. I talked with Adriel and his mother about headaches and migraines, including triggers, preventative medications and treatments. I encouraged diet and life style modifications including adequate fluid intake, adequate sleep and not skipping meals.   For acute headache management, Laurel may take Tylenol and rest in a dark room as he has been doing.   We also discussed the use of preventive medications. It is unlikely that Randall Anderson could manage keeping headache diaries.  I reviewed options for preventative medications, including risks and benefits of medications such as beta blockers, antiepileptic medications, antidepressants and calcium channel blockers. After discussion, the decision was made for him to try Propranolol for migraine prevention. I stressed to Randall Anderson and his mother that this medication will not reduce the frequency of tension headaches. We also talked about ways to improve Randall Anderson's sleep and I outlined simple sleep hygiene measures for him. I asked Mom to let me know if his headaches became more frequent or more severe.   Randall Anderson and his mother agreed with the plans made today. He will return for follow up in 6 months or sooner if needed.   The medication  list was reviewed and reconciled.  No changes were made in the prescribed medications today.  A complete medication list was provided to the patient/caregiver.    Medication List       This list is accurate as of: 12/29/15 11:59 PM.  Always use your most recent med list.               acetaminophen 500 MG tablet  Commonly known as:  TYLENOL  Take 500 mg by mouth every  4 (four) hours as needed.     divalproex 500 MG 24 hr tablet  Commonly known as:  DEPAKOTE ER  Take 1 tablet (500 mg total) by mouth 3 (three) times daily.     levETIRAcetam 500 MG tablet  Commonly known as:  KEPPRA  TAKE 3 TABLETS BY MOUTH EVERY MORNING AND TAKE 3 TABLETS EVERY EVENING     Melatonin 3 MG Tabs  Take 1 tablet at supper for 1 week, then take 2 tablets at supper     PROAIR HFA 108 (90 Base) MCG/ACT inhaler  Generic drug:  albuterol  Inhale 2 puffs into the lungs every 4 (four) hours as needed. Take 2 puffs by mouth as needed for wheezing     propranolol 10 MG tablet  Commonly known as:  INDERAL  Take 1 tablet at bedtime for 1 week, then take 2 tablets at bedtime     VIMPAT 50 MG Tabs tablet  Generic drug:  lacosamide  Take 1 tablet every night (along with the  tablet)     VIMPAT 100 MG Tabs  Generic drug:  Lacosamide  Take 1 tablet by mouth every morning and 1 tablet by mouth every evening        Dr. Sharene Skeans was consulted regarding the patient.   Total time spent with the patient was 30 minutes, of which 50% or more was spent in counseling and coordination of care.   Elveria Rising

## 2015-12-31 DIAGNOSIS — G43009 Migraine without aura, not intractable, without status migrainosus: Secondary | ICD-10-CM | POA: Insufficient documentation

## 2016-01-04 ENCOUNTER — Other Ambulatory Visit: Payer: Self-pay | Admitting: Family

## 2016-01-12 ENCOUNTER — Other Ambulatory Visit: Payer: Self-pay | Admitting: Family

## 2016-01-14 ENCOUNTER — Other Ambulatory Visit: Payer: Self-pay | Admitting: Family

## 2016-02-11 DIAGNOSIS — Z0279 Encounter for issue of other medical certificate: Secondary | ICD-10-CM

## 2016-03-09 ENCOUNTER — Other Ambulatory Visit: Payer: Self-pay | Admitting: Family

## 2016-06-30 ENCOUNTER — Encounter: Payer: Self-pay | Admitting: Family

## 2016-06-30 ENCOUNTER — Ambulatory Visit (INDEPENDENT_AMBULATORY_CARE_PROVIDER_SITE_OTHER): Payer: Medicaid Other | Admitting: Family

## 2016-06-30 VITALS — BP 124/70 | HR 80 | Ht 59.0 in | Wt 130.2 lb

## 2016-06-30 DIAGNOSIS — G911 Obstructive hydrocephalus: Secondary | ICD-10-CM

## 2016-06-30 DIAGNOSIS — G43009 Migraine without aura, not intractable, without status migrainosus: Secondary | ICD-10-CM

## 2016-06-30 DIAGNOSIS — G44219 Episodic tension-type headache, not intractable: Secondary | ICD-10-CM | POA: Diagnosis not present

## 2016-06-30 DIAGNOSIS — R296 Repeated falls: Secondary | ICD-10-CM | POA: Diagnosis not present

## 2016-06-30 DIAGNOSIS — R404 Transient alteration of awareness: Secondary | ICD-10-CM

## 2016-06-30 DIAGNOSIS — F819 Developmental disorder of scholastic skills, unspecified: Secondary | ICD-10-CM | POA: Diagnosis not present

## 2016-06-30 DIAGNOSIS — F39 Unspecified mood [affective] disorder: Secondary | ICD-10-CM | POA: Diagnosis not present

## 2016-06-30 DIAGNOSIS — G40309 Generalized idiopathic epilepsy and epileptic syndromes, not intractable, without status epilepticus: Secondary | ICD-10-CM | POA: Diagnosis not present

## 2016-06-30 MED ORDER — VIMPAT 100 MG PO TABS: ORAL_TABLET | ORAL | 5 refills | Status: DC

## 2016-06-30 MED ORDER — VIMPAT 50 MG PO TABS: ORAL_TABLET | ORAL | 5 refills | Status: DC

## 2016-06-30 MED ORDER — LEVETIRACETAM 500 MG PO TABS: ORAL_TABLET | ORAL | 5 refills | Status: DC

## 2016-06-30 MED ORDER — DIVALPROEX SODIUM ER 500 MG PO TB24: ORAL_TABLET | ORAL | 5 refills | Status: DC

## 2016-06-30 MED ORDER — PROPRANOLOL HCL 10 MG PO TABS: ORAL_TABLET | ORAL | 5 refills | Status: DC

## 2016-06-30 NOTE — Patient Instructions (Addendum)
Randall LitesGregory should continue taking his medications as he has been taking them. Let me know if he has any seizures or if his migraine headaches become more frequent or more severe.   Please plan to return for follow up in 6 months or sooner if needed.

## 2016-06-30 NOTE — Progress Notes (Signed)
Patient: Randall Anderson MRN: [id number] Sex: male DOB: [birth year]  Provider: Elveria Rising, NP Location of Care: [facility]  Note type: Routine return visit  History of Present Illness: Referral Source: Randall Catching, MD History from: patient, referring office, [facility] chart and mother Chief Complaint: Epilespy  Randall Anderson is a 30 y.o. man with history of  congenital hydrocephalus, VP shunt, cognitive delays, migraine headaches, episodes of angry mood and generalized seizures. He is taking and tolerating generic Depakote, Levetiracetam and Vimpat for his seizure disorder. Randall Anderson was last seen December 29, 2015. His mother reports today that says Randall Anderson has had a few brief seizures since he was last seen. He had one fall about a month ago. When Randall Anderson was last seen, he was experiencing frequent severe migraine headaches. He was started on Propranolol and has had significant improvement in headache frequency and severity. Randall Anderson and his mother report today that he has been otherwise generally healthy and doing well since he was last seen.  Neither Mom nor Randall Anderson have other health concerns today other than previously mentioned.  Review of Systems: Please see the HPI for neurologic and other pertinent review of systems. Otherwise, the following systems are noncontributory including constitutional, eyes, ears, nose and throat, cardiovascular, respiratory, gastrointestinal, genitourinary, musculoskeletal, skin, endocrine, hematologic/lymph, allergic/immunologic and psychiatric.   Past Medical History:  Diagnosis Date  . Congenital hydrocephalus (HCC)   . Hx MRSA infection   . Intellectual delay   . Seizures (HCC)   . VP (ventriculoperitoneal) shunt status    Hospitalizations: No., Head Injury: No., Nervous System Infections: No., Immunizations up to date: Yes.   Past Medical History Comments: In October 2015, Randall Anderson was falling frequently. He had CT scan of the head at that  time that was unchanged from prior studies. The falling episodes gradually diminished and resolved without intervention.  Surgical History Past Surgical History:  Procedure Laterality Date  . CIRCUMCISION  1989  . VENTRICULOPERITONEAL SHUNT     VP shunt,  endoscopic surgery to open up the floor of third ventricle and have it communicate with the subarachnoid space.     Family History family history includes Cancer in his father, maternal grandfather, and paternal grandfather; Congestive Heart Failure in his maternal grandmother; Diabetes in his other. Family History is otherwise negative for migraines, seizures, cognitive impairment, blindness, deafness, birth defects, chromosomal disorder, autism.  Social History Social History   Social History  . Marital status: Single    Spouse name: N/A  . Number of children: N/A  . Years of education: N/A   Social History Main Topics  . Smoking status: Passive Smoke Exposure - Never Smoker  . Smokeless tobacco: Never Used     Comment: Mom smokes   . Alcohol use No  . Drug use: No  . Sexual activity: No   Other Topics Concern  . Not on file   Social History Narrative   Randall Anderson graduated from [employer] in 2008. He is not currently employed or enrolled in a day program. Randall Anderson enjoys washing dishes, vacuuming, playing on the computer, and going to church.   Randall Anderson lives with his mother. He has two older siblings that do not reside in the home.        Allergies Allergies  Allergen Reactions  . Other Anaphylaxis    Shell Fish    Physical Exam BP 124/70   Pulse 80   Ht 4\' 11"  (1.499 m)   Wt 130 lb 3.2  oz (59.1 kg)   BMI 26.30 kg/m  General: well developed, well nourished young man with short stature, in no evident distress  Head: head is large and brachycephalic  Ears, Nose and Throat: poor dentition  Neck: supple with no carotid or supraclavicular bruits.  Respiratory: lungs clear to auscultation   Cardiovascular: regular rate and rhythm, no murmurs  Musculoskeletal: neuromuscular scoliosis - stable  Skin: no rashes or lesions.  Neurologic Exam  Mental Status: Awake and fully alert. Oriented to place and time. He had difficulty in answering questions and looked to his mother for help.  Cranial Nerves: Fundoscopic exam reveals sharp disc margins. Pupils equal, briskly reactive to light. Extraocular movements full without nystagmus. Visual fields full to confrontation. Hearing intact and symmetric to finger rub. Facial sensation intact. Face, tongue, palate move normally and symmetrically. Neck flexion and extension normal.  Motor: Normal bulk and tone. Normal strength in all tested extremity muscles.  Sensory: Refused to answer questions about sensory examination.  Coordination: Rapid alternating movements slightly clumsy but otherwise normal in all extremities. Finger-to-nose and heel-to-shin performed accurately bilaterally. Romberg negative.  Gait and Station: Arises from chair without difficulty. Stance is normal. Gait is normal. He tends to walk quickly, slightly on his toes, but is otherwise normal. Reflexes: Diminished and symmetric. Toes downgoing.   Impression 1. Generalized seizure disorder 2. History of congential hydrocephalus with VP shunt 3. Migraine without aura 4. Chronic tension headaches 5. Insufficient sleep pattern 6. History of episodes of angry mood 7. Cognitive delay   Recommendations for plan of care The patient's previous [facility] records were reviewed. [name] has neither had nor required imaging or lab studies since the last visit. He is a 30 year old young man with history of congenital hydrocephalus, VP shunt, migraine headaches and generalized seizures. He is taking and tolerating generic Depakote, Levetiracetam and Vimpat for his seizure disorder. He continues to have intermittent brief seizures that are intractable. Randall Anderson is taking and tolerating  Propranolol for migraine prevention. The migraine frequency and severity has improved since being on this medication. I reminded Randall Anderson of the need for him to get adequate sleep, to avoid skipping meals and to drink plenty of water each day. Randall Anderson and his mother agreed with the plans made today. He will return for follow up in 6 months or sooner if needed.   The medication list was reviewed and reconciled.  No changes were made in the prescribed medications today.  A complete medication list was provided to the patient and his mother.    Medication List       Accurate as of 06/30/16 11:59 PM. Always use your most recent med list.          acetaminophen 500 MG tablet Commonly known as:  TYLENOL Take 500 mg by mouth every 4 (four) hours as needed.   divalproex 500 MG 24 hr tablet Commonly known as:  DEPAKOTE ER TAKE 1 TABLET (500 MG TOTAL) BY MOUTH 3 (THREE) TIMES DAILY.   levETIRAcetam 500 MG tablet Commonly known as:  KEPPRA TAKE 3 TABLETS BY MOUTH EVERY MORNING AND TAKE 3 TABLETS EVERY EVENING   Melatonin 3 MG Tabs Take 1 tablet at supper for 1 week, then take 2 tablets at supper   PROAIR HFA 108 (90 Base) MCG/ACT inhaler Generic drug:  albuterol Inhale 2 puffs into the lungs every 4 (four) hours as needed. Take 2 puffs by mouth as needed for wheezing   propranolol 10 MG tablet Commonly known  as:  INDERAL Take take 2 tablets at bedtime   VIMPAT 100 MG Tabs Generic drug:  Lacosamide TAKE 1 TABLET BY MOUTH EVERY MORNING AND 1 TABLET EVERY EVENING   VIMPAT 50 MG Tabs tablet Generic drug:  lacosamide TAKE 1 TABLET BY MOUTH EVERY NIGHT (ALONG WITH 100MG  TABLET)       Total time spent with the patient was 20 minutes, of which 50% or more was spent in counseling and coordination of care.   Randall Risingina Anel Purohit

## 2016-07-31 ENCOUNTER — Other Ambulatory Visit: Payer: Self-pay | Admitting: Family

## 2016-07-31 DIAGNOSIS — G40309 Generalized idiopathic epilepsy and epileptic syndromes, not intractable, without status epilepticus: Secondary | ICD-10-CM

## 2017-01-21 ENCOUNTER — Other Ambulatory Visit: Payer: Self-pay | Admitting: Pediatrics

## 2017-01-21 ENCOUNTER — Other Ambulatory Visit: Payer: Self-pay | Admitting: Family

## 2017-01-21 DIAGNOSIS — G40309 Generalized idiopathic epilepsy and epileptic syndromes, not intractable, without status epilepticus: Secondary | ICD-10-CM

## 2017-01-24 ENCOUNTER — Telehealth (INDEPENDENT_AMBULATORY_CARE_PROVIDER_SITE_OTHER): Payer: Self-pay | Admitting: Family

## 2017-01-24 NOTE — Telephone Encounter (Signed)
-----   Message from [name], RN sent at 01/24/2017  8:59 AM EDT ----- Please call and schedule a follow up with [name].

## 2017-01-24 NOTE — Telephone Encounter (Signed)
°  Who's calling (name and relationship to patient) : [name] (mom) Best contact number: [phone] Provider they see: Blane Ohara Reason for call: Need refill of medication.  Please call.    PRESCRIPTION REFILL ONLY  Name of prescription:Depakote, Vimpat, and another  Pharmacy:[facility] [place]

## 2017-01-24 NOTE — Telephone Encounter (Signed)
-----   Message from Chelsea E White, RN sent at 01/24/2017  8:59 AM EDT ----- °Please call and schedule a follow up with [name]. °

## 2017-01-24 NOTE — Telephone Encounter (Signed)
Appointment scheduled for January 31, 2017 at 10:30am

## 2017-01-31 ENCOUNTER — Ambulatory Visit (INDEPENDENT_AMBULATORY_CARE_PROVIDER_SITE_OTHER): Payer: Medicaid Other | Admitting: Family

## 2017-01-31 ENCOUNTER — Telehealth (INDEPENDENT_AMBULATORY_CARE_PROVIDER_SITE_OTHER): Payer: Self-pay | Admitting: Family

## 2017-01-31 ENCOUNTER — Encounter (INDEPENDENT_AMBULATORY_CARE_PROVIDER_SITE_OTHER): Payer: Self-pay | Admitting: Family

## 2017-01-31 VITALS — BP 116/70 | HR 60 | Resp 16 | Ht 59.0 in | Wt 135.6 lb

## 2017-01-31 DIAGNOSIS — F819 Developmental disorder of scholastic skills, unspecified: Secondary | ICD-10-CM

## 2017-01-31 DIAGNOSIS — G43009 Migraine without aura, not intractable, without status migrainosus: Secondary | ICD-10-CM | POA: Diagnosis not present

## 2017-01-31 DIAGNOSIS — R296 Repeated falls: Secondary | ICD-10-CM | POA: Diagnosis not present

## 2017-01-31 DIAGNOSIS — G40309 Generalized idiopathic epilepsy and epileptic syndromes, not intractable, without status epilepticus: Secondary | ICD-10-CM | POA: Diagnosis not present

## 2017-01-31 DIAGNOSIS — F39 Unspecified mood [affective] disorder: Secondary | ICD-10-CM | POA: Diagnosis not present

## 2017-01-31 DIAGNOSIS — G44219 Episodic tension-type headache, not intractable: Secondary | ICD-10-CM

## 2017-01-31 DIAGNOSIS — Z79899 Other long term (current) drug therapy: Secondary | ICD-10-CM | POA: Diagnosis not present

## 2017-01-31 DIAGNOSIS — G911 Obstructive hydrocephalus: Secondary | ICD-10-CM | POA: Diagnosis not present

## 2017-01-31 MED ORDER — VIMPAT 50 MG PO TABS: ORAL_TABLET | ORAL | 5 refills | Status: DC

## 2017-01-31 MED ORDER — LEVETIRACETAM 500 MG PO TABS: ORAL_TABLET | ORAL | 5 refills | Status: DC

## 2017-01-31 MED ORDER — VIMPAT 100 MG PO TABS: ORAL_TABLET | ORAL | 5 refills | Status: DC

## 2017-01-31 MED ORDER — PROPRANOLOL HCL 10 MG PO TABS
ORAL_TABLET | ORAL | 5 refills | Status: DC
Start: 2017-01-31 — End: 2017-10-31

## 2017-01-31 MED ORDER — DIVALPROEX SODIUM ER 500 MG PO TB24: 500.0000 mg | ORAL_TABLET | Freq: Three times a day (TID) | ORAL | 5 refills | Status: DC

## 2017-01-31 NOTE — Progress Notes (Signed)
Patient: Randall Anderson MRN: [id number] Sex: male DOB: 1986/06/17  Provider: Elveria Rising, NP Location of Care: [facility]  Note type: Routine return visit  History of Present Illness: Referral Source: [facility] History from: mother Chief Complaint: Follow up on seizure a few weeks ago   TRAVAS SCHEXNAYDER is a 31 y.o. man with history of  congenital hydrocephalus, VP shunt, cognitive delays, migraine headaches, episodes of angry mood and generalized seizures. He is taking and tolerating generic Depakote, Levetiracetam and Vimpat for his seizure disorder. [name] was last seen June 30, 2016. His mother reports today that says [name] has had a few brief seizures since he was last seen. He had one fall during a seizure a few weeks ago. [name] is taking and tolerating Propranolol for migraine prevention and reports today that he has had very few migraines since starting on the medication.  [name] has been otherwise generally healthy since he was last seen. Neither Mom nor [name] have other health concerns today other than previously mentioned.  Review of Systems: Please see the HPI for neurologic and other pertinent review of systems. Otherwise, the following systems are noncontributory including constitutional, eyes, ears, nose and throat, cardiovascular, respiratory, gastrointestinal, genitourinary, musculoskeletal, skin, endocrine, hematologic/lymph, allergic/immunologic and psychiatric.   Past Medical History:  Diagnosis Date  . Congenital hydrocephalus (HCC)   . Hx MRSA infection   . Intellectual delay   . Seizures (HCC)   . VP (ventriculoperitoneal) shunt status    Hospitalizations:no, Head Injury: no, Nervous System Infections: No., Immunizations up to date: Yes.   Past Medical History Comments: In October 2015, [name] was falling frequently. He had CT scan of the head at that time that was unchanged from prior studies. The falling episodes  gradually diminished and resolved without intervention  Surgical History Past Surgical History:  Procedure Laterality Date  . CIRCUMCISION  1989  . VENTRICULOPERITONEAL SHUNT     VP shunt,  endoscopic surgery to open up the floor of third ventricle and have it communicate with the subarachnoid space.     Family History family history includes Cancer in his father, maternal grandfather, and paternal grandfather; Congestive Heart Failure in his maternal grandmother; Diabetes in his other. Family History is otherwise negative for migraines, seizures, cognitive impairment, blindness, deafness, birth defects, chromosomal disorder, autism.  Social History Social History   Social History  . Marital status: Single    Spouse name: N/A  . Number of children: N/A  . Years of education: N/A   Social History Main Topics  . Smoking status: Passive Smoke Exposure - Never Smoker  . Smokeless tobacco: Never Used     Comment: Mom smokes   . Alcohol use No  . Drug use: No  . Sexual activity: No   Other Topics Concern  . Not on file   Social History Narrative   Randall Anderson graduated from [employer] in 2008. He is not currently employed or enrolled in a day program. Randal enjoys washing dishes, vacuuming, playing on the computer, and going to church.   Randall Anderson lives with his mother. He has two older siblings that do not reside in the home.        Allergies Allergies  Allergen Reactions  . Other Anaphylaxis    Shell Fish    Physical Exam BP 116/70   Pulse 60   Resp 16   Ht  (1.499 m)   Wt 135 lb 9.6 oz (61.5 kg)  BMI 27.39 kg/m  General: well developed, well nourished young man with short stature, in no evident distress  Head: head is large and brachycephalic  Ears, Nose and Throat: poor dentition  Neck: supple with no carotid or supraclavicular bruits.  Respiratory: lungs clear to auscultation  Cardiovascular: regular rate and rhythm, no murmurs   Musculoskeletal: neuromuscular scoliosis - stable  Skin: no rashes or lesions.  Neurologic Exam  Mental Status: Awake and fully alert. Oriented to place and time. He had difficulty in answering questions and looked to his mother for help.  Cranial Nerves: Fundoscopic exam reveals sharp disc margins. Pupils equal, briskly reactive to light. Extraocular movements full without nystagmus. Visual fields full to confrontation. Hearing intact and symmetric to finger rub. Facial sensation intact. Face, tongue, palate move normally and symmetrically. Neck flexion and extension normal.  Motor: Normal bulk and tone. Normal strength in all tested extremity muscles.  Sensory: Refused to answer questions about sensory examination.  Coordination: Rapid alternating movements slightly clumsy but otherwise normal in all extremities. Finger-to-nose and heel-to-shin performed accurately bilaterally. Romberg negative.  Gait and Station: Arises from chair without difficulty. Stance is normal. Gait is normal. He tends to walk quickly, slightly on his toes, but is otherwise normal. Reflexes: Diminished and symmetric. Toes downgoing  Impression 1. Generalized seizure disorder 2. History of congential hydrocephalus with VP shunt 3. Migraine without aura 4. Chronic tension headaches 5. Insufficient sleep pattern 6. History of episodes of angry mood 7. Cognitive delay  Recommendations for plan of care The patient's previous [facility] records were reviewed. Carley has neither had nor required imaging or lab studies since the last visit. He is a 31 year old young man with history of congenital hydrocephalus, VP shunt, migraine headaches and generalized seizures. He is taking and tolerating generic Depakote, Levetiracetam and Vimpat for his seizure disorder. He continues to have intermittent brief seizures that are intractable. [name] is taking and tolerating Propranolol for migraine prevention. If his migraines continue  to be in good control at his next visit, I may reduce the Propranolol dose slightly to see if he could be tapered off that medication. I reminded [name] of the need for him to get adequate sleep, to avoid skipping meals and to drink plenty of water each day. [name] and his mother agreed with the plans made today. He will return for follow up in 6 months or sooner if needed  The medication list was reviewed and reconciled.  No changes were made in the prescribed medications today.  A complete medication list was provided to the patient/caregiver.  Allergies as of 01/31/2017      Reactions   Other Anaphylaxis   Shell Fish      Medication List       Accurate as of 01/31/17  4:48 PM. Always use your most recent med list.          acetaminophen 500 MG tablet Commonly known as:  TYLENOL Take 500 mg by mouth every 4 (four) hours as needed.   divalproex 500 MG 24 hr tablet Commonly known as:  DEPAKOTE ER Take 1 tablet (500 mg total) by mouth 3 (three) times daily.   levETIRAcetam 500 MG tablet Commonly known as:  KEPPRA TAKE 3 TABLETS EVERY MORNING AND 3 TABLETS EVERY EVENING   Melatonin 3 MG Tabs Take 1 tablet at supper for 1 week, then take 2 tablets at supper   PROAIR HFA 108 (90 Base) MCG/ACT inhaler Generic drug:  albuterol Inhale 2 puffs  into the lungs every 4 (four) hours as needed. Take 2 puffs by mouth as needed for wheezing   propranolol 10 MG tablet Commonly known as:  INDERAL Take take 2 tablets at bedtime   VIMPAT 100 MG Tabs Generic drug:  Lacosamide TAKE 1 TABLET BY MOUTH EACH MORNING AND 1 TABLET EVERY EVENING   VIMPAT 50 MG Tabs tablet Generic drug:  lacosamide TAKE 1 TABLET BY MOUTH EVERY NIGHT (ALONG WITH )       Total time spent with the patient was 25 minutes, of which 50% or more was spent in counseling and coordination of care.   Elveria Rising NP-C

## 2017-01-31 NOTE — Patient Instructions (Signed)
Continue your medications as you have been taking them. Let me know if your seizures become more frequent or more severe.   Let me know if your migraines before more frequent or more severe.   Please plan to return for follow up in 6 months or sooner if needed.

## 2017-01-31 NOTE — Telephone Encounter (Signed)
Had conversation with Randall Anderson (mom) about guardianship paperwork for adult child Randall Anderson.   She stated she will look into it.

## 2017-08-06 ENCOUNTER — Other Ambulatory Visit (INDEPENDENT_AMBULATORY_CARE_PROVIDER_SITE_OTHER): Payer: Self-pay | Admitting: Family

## 2017-08-06 DIAGNOSIS — G40309 Generalized idiopathic epilepsy and epileptic syndromes, not intractable, without status epilepticus: Secondary | ICD-10-CM

## 2017-08-09 ENCOUNTER — Other Ambulatory Visit (INDEPENDENT_AMBULATORY_CARE_PROVIDER_SITE_OTHER): Payer: Self-pay | Admitting: Family

## 2017-08-09 DIAGNOSIS — G40309 Generalized idiopathic epilepsy and epileptic syndromes, not intractable, without status epilepticus: Secondary | ICD-10-CM

## 2017-09-09 ENCOUNTER — Other Ambulatory Visit (INDEPENDENT_AMBULATORY_CARE_PROVIDER_SITE_OTHER): Payer: Self-pay | Admitting: Family

## 2017-09-09 DIAGNOSIS — G40309 Generalized idiopathic epilepsy and epileptic syndromes, not intractable, without status epilepticus: Secondary | ICD-10-CM

## 2017-10-09 ENCOUNTER — Other Ambulatory Visit: Payer: Self-pay | Admitting: Pediatrics

## 2017-10-09 DIAGNOSIS — G40309 Generalized idiopathic epilepsy and epileptic syndromes, not intractable, without status epilepticus: Secondary | ICD-10-CM

## 2017-10-10 ENCOUNTER — Telehealth (INDEPENDENT_AMBULATORY_CARE_PROVIDER_SITE_OTHER): Payer: Self-pay | Admitting: Family

## 2017-10-10 NOTE — Telephone Encounter (Signed)
Patient needs a follow up appointment with Inetta Fermoina in January. I left a message at  [phone] advising to call our office back and schedule with Inetta Fermoina. Rufina FalcoEmily M Hull

## 2017-10-19 ENCOUNTER — Ambulatory Visit (INDEPENDENT_AMBULATORY_CARE_PROVIDER_SITE_OTHER): Payer: Self-pay | Admitting: Family

## 2017-10-31 ENCOUNTER — Ambulatory Visit (INDEPENDENT_AMBULATORY_CARE_PROVIDER_SITE_OTHER): Payer: Medicaid Other | Admitting: Family

## 2017-10-31 ENCOUNTER — Encounter (INDEPENDENT_AMBULATORY_CARE_PROVIDER_SITE_OTHER): Payer: Self-pay | Admitting: Family

## 2017-10-31 VITALS — BP 110/70 | HR 64 | Ht 59.5 in | Wt 139.8 lb

## 2017-10-31 DIAGNOSIS — G40309 Generalized idiopathic epilepsy and epileptic syndromes, not intractable, without status epilepticus: Secondary | ICD-10-CM

## 2017-10-31 DIAGNOSIS — G44219 Episodic tension-type headache, not intractable: Secondary | ICD-10-CM | POA: Diagnosis not present

## 2017-10-31 DIAGNOSIS — G911 Obstructive hydrocephalus: Secondary | ICD-10-CM | POA: Diagnosis not present

## 2017-10-31 DIAGNOSIS — G43009 Migraine without aura, not intractable, without status migrainosus: Secondary | ICD-10-CM | POA: Diagnosis not present

## 2017-10-31 DIAGNOSIS — F39 Unspecified mood [affective] disorder: Secondary | ICD-10-CM

## 2017-10-31 DIAGNOSIS — F819 Developmental disorder of scholastic skills, unspecified: Secondary | ICD-10-CM

## 2017-10-31 MED ORDER — LEVETIRACETAM 500 MG PO TABS: ORAL_TABLET | ORAL | 5 refills | Status: DC

## 2017-10-31 MED ORDER — DIVALPROEX SODIUM ER 500 MG PO TB24: 500.0000 mg | ORAL_TABLET | Freq: Three times a day (TID) | ORAL | 5 refills | Status: DC

## 2017-10-31 MED ORDER — VIMPAT 50 MG PO TABS: 50.0000 mg | ORAL_TABLET | Freq: Every evening | ORAL | 5 refills | Status: DC

## 2017-10-31 MED ORDER — PROPRANOLOL HCL 10 MG PO TABS: ORAL_TABLET | ORAL | 5 refills | Status: DC

## 2017-10-31 MED ORDER — LACOSAMIDE 100 MG PO TABS: ORAL_TABLET | ORAL | 5 refills | Status: DC

## 2017-10-31 NOTE — Progress Notes (Signed)
Patient: Randall Anderson MRN: [id number] Sex: male DOB: 1986/01/09  Provider: Elveria Rising, NP Location of Care: [facility]  Note type: Routine return visit  History of Present Illness: Referral Source: [facility]- History from: mother and [facility] chart Chief Complaint: Seizures  Randall Anderson is a 32 y.o.man with history of congenital hydrocephalus, VP shunt, cognitive delays, migraine and tension headaches, episodes of angry mood, intellectual delay and generalized seizures. He is taking and tolerating Divalproex, Levetiracetam and Vimpat for his seizure disorder. Randall Anderson was last seen January 31, 2017. Today his mother reports that he has been seizure free since he was last seen. He is taking and tolerating Propranolol for migraine prevention, and Mom says that Randall Anderson has had more headaches in the last few months. The headaches that he reports have pounding pain, nausea, intolerance to light and require sleep to resolve.  He has other headaches that are less severe that consist of a pressure across his forehead. Mom says that Randall Anderson has trouble going to sleep and staying asleep. He does not skip meals. He drinks water and soft drinks during the day.   Randall Anderson has been otherwise healthy since his last visit. Neither he nor his mother have other health concerns for him today other than previously mentioned.  Review of Systems: Please see the HPI for neurologic and other pertinent review of systems. Otherwise, all other systems were reviewed and were negative.    Past Medical History:  Diagnosis Date  . Congenital hydrocephalus (HCC)   . Hx MRSA infection   . Intellectual delay   . Seizures (HCC)   . VP (ventriculoperitoneal) shunt status    Hospitalizations: No., Head Injury: No., Nervous System Infections: No., Immunizations up to date: Yes.   Past Medical History Comments: In October 2015, Randall Anderson was falling frequently. He had CT scan of the head at that  time that was unchanged from prior studies. The falling episodes gradually diminished and resolved without intervention    Surgical History Past Surgical History:  Procedure Laterality Date  . CIRCUMCISION  1989  . VENTRICULOPERITONEAL SHUNT     VP shunt,  endoscopic surgery to open up the floor of third ventricle and have it communicate with the subarachnoid space.     Family History family history includes Cancer in his father, maternal grandfather, and paternal grandfather; Congestive Heart Failure in his maternal grandmother; Diabetes in his other. Family History is otherwise negative for migraines, seizures, cognitive impairment, blindness, deafness, birth defects, chromosomal disorder, autism.  Social History Social History   Socioeconomic History  . Marital status: Single    Spouse name: None  . Number of children: None  . Years of education: None  . Highest education level: None  Social Needs  . Financial resource strain: None  . Food insecurity - worry: None  . Food insecurity - inability: None  . Transportation needs - medical: None  . Transportation needs - non-medical: None  Occupational History  . None  Tobacco Use  . Smoking status: Passive Smoke Exposure - Never Smoker  . Smokeless tobacco: Never Used  . Tobacco comment: Mom smokes   Substance and Sexual Activity  . Alcohol use: No    Alcohol/week: 0.0 oz  . Drug use: No  . Sexual activity: No  Other Topics Concern  . None  Social History Narrative   Brockton graduated from [employer] in 2008. Remigio enjoys washing dishes, vacuuming, playing on the computer, and going to church.  [name] lives with his mother. He has 1 older and 1 younger siblings that do not reside in the home.     Allergies Allergies  Allergen Reactions  . Other Anaphylaxis    Shell Fish    Physical Exam BP 110/70   Pulse 64   Ht 4' 11.5" (1.511 m)   Wt 139 lb 12.8 oz (63.4 kg)   BMI 27.76 kg/m  General:  well developed, well nourished man with short stature, seated on exam table, in no evident distress; brown hair, brown eyes, right handed.  Head: large for his size, brachyocephalic and atraumatic. Oropharynx benign. No dysmorphic features. Has poor dentition Neck: supple with no carotid bruits. No focal tenderness. Cardiovascular: regular rate and rhythm, no murmurs. Respiratory: Clear to auscultation bilaterally Abdomen: Bowel sounds present all four quadrants, abdomen soft, non-tender, non-distended. No hepatosplenomegaly or masses palpated. Musculoskeletal: No skeletal deformities. Has neuromuscular scoliosis Skin: no rashes or neurocutaneous lesions.   Neurologic Exam Mental Status: Awake and fully alert.  Attention span and concentration appropriate for age. Fund of knowledge is below normal for age. Speech with slight dysarthria.  Had difficulty answering questions and looked to his mother for help. Able to follow simple commands and participate in examination. Cranial Nerves: Fundoscopic exam - red reflex present.  Unable to fully visualize fundus.  Pupils equal briskly reactive to light.  Extraocular movements full without nystagmus.  Visual fields full to confrontation.  Hearing intact and symmetric to whisper.  Facial sensation intact.  Face, tongue, palate move normally and symmetrically.  Neck flexion and extension normal. Motor: Normal bulk and tone.  Normal strength in all tested extremity muscles. Sensory: Intact to touch and temperature in all extremities. Coordination: Rapid movements: finger and toe tapping clumsy and symmetric bilaterally.  Finger-to-nose and heel-to-shin intact bilaterally.  Had difficulty balancing on either foot. Romberg negative. Gait and Station: Arises from chair, without difficulty. Stance is normal.  Gait is slightly wide based with fairly normal stride length and balance. He tends to walk quickly and slightly on his toes. He was able to heel and toe walk  but was unable to do tandem walk. Reflexes: Diminished and symmetric. Toes downgoing. No clonus.  Impression 1.  Generalized seizure disorder 2.  History of congenital hydrocephalus with VP shunt 3.  Migraine without aura 4.  Chronic tension headaches 5.  Insufficient sleep pattern 6.  History of episodes of angry mood 7.  Intellectual delay   Recommendations for plan of care The patient's previous [facility] records were reviewed. [name] has neither had nor required imaging or lab studies since the last visit. He is a 32 year old man with history of generalized seizure disorder, history of congenital hydrocephalus with VP shunt, migraine without aura, tension headaches, insufficient sleep pattern, history of angry mood and intellectual delay. He is taking and tolerating Divalproex, Levetiracetam and Vimpat for his seizures and has remained seizure free since his last visit in April 2018. Randall SoursGreg is taking and tolerating Propranolol for his seizure disorder and his mother reports increase in migraine frequency. I recommended increase in Propranolol from 20mg  to 30 mg, and reminded them of the need for [name] to get at least 8 hours of sleep and to be drinking plenty of water each day. I will see him back in follow up in 6 months or sooner if needed. Mom agreed with the plans made today.   The medication list was reviewed and reconciled.  I reviewe changes that were made in the  prescribed medications today.  A complete medication list was provided to the patient's mother.  Allergies as of 10/31/2017      Reactions   Other Anaphylaxis   Shell Fish      Medication List        Accurate as of 10/31/17 11:59 PM. Always use your most recent med list.          acetaminophen 500 MG tablet Commonly known as:  TYLENOL Take 500 mg by mouth every 4 (four) hours as needed.   divalproex 500 MG 24 hr tablet Commonly known as:  DEPAKOTE ER Take 1 tablet (500 mg total) by mouth 3 (three) times daily.     Lacosamide 100 MG Tabs Commonly known as:  VIMPAT TAKE 1 TABLET BY MOUTH EVERY MORNING AND EVERY EVENING   VIMPAT 50 MG Tabs tablet Generic drug:  lacosamide Take 1 tablet (50 mg total) by mouth every evening.   levETIRAcetam 500 MG tablet Commonly known as:  KEPPRA TAKE 3 TABLETS EVERY MORNING AND 3 TABLETS EVERY EVENING   Melatonin 3 MG Tabs Take 1 tablet at supper for 1 week, then take 2 tablets at supper   PROAIR HFA 108 (90 Base) MCG/ACT inhaler Generic drug:  albuterol Inhale 2 puffs into the lungs every 4 (four) hours as needed. Take 2 puffs by mouth as needed for wheezing   propranolol 10 MG tablet Commonly known as:  INDERAL Take take 3 tablets at bedtime       Dr. Sharene Skeans was consulted regarding the patient.   Total time spent with the patient was 20 minutes, of which 50% or more was spent in counseling and coordination of care.   Elveria Rising NP-C

## 2017-10-31 NOTE — Patient Instructions (Signed)
Thank you for coming in today.   Instructions for you until your next appointment are as follows: 1. Continue your seizure medicines as you have been taking them.  2. For your headaches, increase the Propranolol to 3 tablets at bedtime.  3. Let me know if you have any side effects or if your headaches do not improve 4. Please plan to return for follow up in 6 months or sooner if needed.

## 2017-11-01 ENCOUNTER — Other Ambulatory Visit (INDEPENDENT_AMBULATORY_CARE_PROVIDER_SITE_OTHER): Payer: Self-pay | Admitting: Family

## 2017-11-01 DIAGNOSIS — G40309 Generalized idiopathic epilepsy and epileptic syndromes, not intractable, without status epilepticus: Secondary | ICD-10-CM

## 2017-11-01 DIAGNOSIS — G43009 Migraine without aura, not intractable, without status migrainosus: Secondary | ICD-10-CM

## 2017-11-01 MED ORDER — DIVALPROEX SODIUM ER 500 MG PO TB24: 500.0000 mg | ORAL_TABLET | Freq: Three times a day (TID) | ORAL | 5 refills | Status: DC

## 2017-11-01 MED ORDER — VIMPAT 50 MG PO TABS: 50.0000 mg | ORAL_TABLET | Freq: Every evening | ORAL | 5 refills | Status: DC

## 2017-11-01 MED ORDER — LACOSAMIDE 100 MG PO TABS: ORAL_TABLET | ORAL | 5 refills | Status: DC

## 2017-11-01 MED ORDER — LEVETIRACETAM 500 MG PO TABS: ORAL_TABLET | ORAL | 5 refills | Status: DC

## 2017-11-01 MED ORDER — PROPRANOLOL HCL 10 MG PO TABS
ORAL_TABLET | ORAL | 5 refills | Status: DC
Start: 2017-11-01 — End: 2018-05-16

## 2017-11-01 NOTE — Telephone Encounter (Signed)
[facility] [place]

## 2017-11-01 NOTE — Telephone Encounter (Signed)
Pharmacy has been updated in the chart. Medications have been sent to the pharmacy electronically. Rx has been printed and placed on [name]'s desk

## 2017-11-01 NOTE — Telephone Encounter (Signed)
°  Who's calling (name and relationship to patient) : Lupita LeashDonna, guardian Best contact number: [phone] Provider they see: Elveria Risingina Goodpasture Reason for call: Mother [facility] and [facility] [place]. Please remove [facility] from patient's chart and [facility] [place].      PRESCRIPTION REFILL ONLY  Name of prescription:   Pharmacy:

## 2017-11-03 ENCOUNTER — Encounter (INDEPENDENT_AMBULATORY_CARE_PROVIDER_SITE_OTHER): Payer: Self-pay | Admitting: Family

## 2017-11-09 ENCOUNTER — Telehealth (INDEPENDENT_AMBULATORY_CARE_PROVIDER_SITE_OTHER): Payer: Self-pay | Admitting: Family

## 2017-11-09 NOTE — Telephone Encounter (Signed)
°  Who's calling (name and relationship to patient) : Mom/Donna  Best contact number: [phone]  Provider they see: Sula Sodaina G.  Reason for call: Mom called in requesting to have all rx's sent to new pharmacy as requested klast week; [facility] [place] and is not able to get pt's rx's filled, [facility] still has all of his rx's. Mom requests a call back as soon as possible to confirm the issue has been addressed at the pharmacy please. Pt only has enough to get him through Friday, [facility] pharmacy is too far from pt.

## 2017-11-09 NOTE — Telephone Encounter (Signed)
Spoke with mom to inform her that [facility] [place] to see what was going on. The pharmacists informed me that they called [facility] in [place] to have them transfer the rx to them but [facility] had already filled them. I informed her that [facility] will [facility] to have them [facility] the refill and transfer everything to them. The [facility] will be [facility]

## 2017-11-11 ENCOUNTER — Other Ambulatory Visit (INDEPENDENT_AMBULATORY_CARE_PROVIDER_SITE_OTHER): Payer: Self-pay | Admitting: Family

## 2017-11-11 DIAGNOSIS — G43009 Migraine without aura, not intractable, without status migrainosus: Secondary | ICD-10-CM

## 2018-05-16 ENCOUNTER — Other Ambulatory Visit (INDEPENDENT_AMBULATORY_CARE_PROVIDER_SITE_OTHER): Payer: Self-pay | Admitting: Family

## 2018-05-16 DIAGNOSIS — G40309 Generalized idiopathic epilepsy and epileptic syndromes, not intractable, without status epilepticus: Secondary | ICD-10-CM

## 2018-05-16 DIAGNOSIS — G43009 Migraine without aura, not intractable, without status migrainosus: Secondary | ICD-10-CM

## 2018-06-01 ENCOUNTER — Ambulatory Visit (INDEPENDENT_AMBULATORY_CARE_PROVIDER_SITE_OTHER): Payer: Medicaid Other | Admitting: Family

## 2018-06-01 ENCOUNTER — Encounter (INDEPENDENT_AMBULATORY_CARE_PROVIDER_SITE_OTHER): Payer: Self-pay | Admitting: Family

## 2018-06-01 VITALS — BP 120/90 | HR 64 | Ht 59.5 in | Wt 152.2 lb

## 2018-06-01 DIAGNOSIS — F819 Developmental disorder of scholastic skills, unspecified: Secondary | ICD-10-CM | POA: Diagnosis not present

## 2018-06-01 DIAGNOSIS — F39 Unspecified mood [affective] disorder: Secondary | ICD-10-CM

## 2018-06-01 DIAGNOSIS — G43009 Migraine without aura, not intractable, without status migrainosus: Secondary | ICD-10-CM

## 2018-06-01 DIAGNOSIS — G40309 Generalized idiopathic epilepsy and epileptic syndromes, not intractable, without status epilepticus: Secondary | ICD-10-CM | POA: Diagnosis not present

## 2018-06-01 DIAGNOSIS — G911 Obstructive hydrocephalus: Secondary | ICD-10-CM

## 2018-06-01 MED ORDER — VIMPAT 100 MG PO TABS: ORAL_TABLET | ORAL | 5 refills | Status: DC

## 2018-06-01 MED ORDER — PROPRANOLOL HCL 10 MG PO TABS: ORAL_TABLET | ORAL | 5 refills | Status: DC

## 2018-06-01 MED ORDER — VIMPAT 50 MG PO TABS: ORAL_TABLET | ORAL | 5 refills | Status: DC

## 2018-06-01 MED ORDER — DIVALPROEX SODIUM ER 500 MG PO TB24: 500.0000 mg | ORAL_TABLET | Freq: Three times a day (TID) | ORAL | 5 refills | Status: DC

## 2018-06-01 MED ORDER — LEVETIRACETAM 500 MG PO TABS: ORAL_TABLET | ORAL | 5 refills | Status: DC

## 2018-06-01 NOTE — Patient Instructions (Signed)
Thank you for coming in today.   Instructions for you until your next appointment are as follows: 1. Continue giving Randall Anderson his medications as you have been doing. 2. Let me know if his seizures or migraines become more frequent or more severe 3. Please plan to return for follow up in 6 months or sooner if needed.

## 2018-06-01 NOTE — Progress Notes (Signed)
Patient: Randall Anderson MRN: [id number] Sex: male DOB: 1985/11/09  Provider: Elveria Risingina Goodpasture, NP Location of Care: [facility]  Note type: Routine return visit  History of Present Illness: Referral Source: [facility] History from: mother, patient and [facility] chart Chief Complaint: Seizures  Randall Anderson is a 32 y.o. man with history of congenital hydrocephalus, VP shunt, cognitive delays, migraine and tension headaches, episodes of angry mood, intellectual delay and generalized seizures. He was last seen October 31, 2017.  He is taking and tolerating Divalproex, Levetiracetam, and Vimpat for his seizure disorder. He has occasional breakthrough seizures that are brief. Mom says that he has had 1 seizure since he was last seen. Randall Anderson is also taking and tolerating Propranolol for migraine prevention, and reports today that his migraines have not been problematic. Mom reports today that Randall Anderson's mood has been steady and that he has been doing well since his last visit. He has been generally healthy and neither he nor his mother have other health concerns for him today other than previously mentioned.   Review of Systems: Please see the HPI for neurologic and other pertinent review of systems. Otherwise, all other systems were reviewed and were negative.    Past Medical History:  Diagnosis Date  . Congenital hydrocephalus (HCC)   . Hx MRSA infection   . Intellectual delay   . Seizures (HCC)   . VP (ventriculoperitoneal) shunt status    Hospitalizations: No., Head Injury: No., Nervous System Infections: No., Immunizations up to date: Yes.   Past Medical History Comments: In October 2015, Randall Anderson was falling frequently. He had CT scan of the head at that time that was unchanged from prior studies. The falling episodes gradually diminished and resolved without intervention.  Surgical History Past Surgical History:  Procedure Laterality Date  . CIRCUMCISION   1989  . VENTRICULOPERITONEAL SHUNT     VP shunt,  endoscopic surgery to open up the floor of third ventricle and have it communicate with the subarachnoid space.     Family History family history includes Cancer in his father, maternal grandfather, and paternal grandfather; Congestive Heart Failure in his maternal grandmother; Diabetes in his other. Family History is otherwise negative for migraines, seizures, cognitive impairment, blindness, deafness, birth defects, chromosomal disorder, autism.  Social History Social History   Socioeconomic History  . Marital status: Single    Spouse name: Not on file  . Number of children: Not on file  . Years of education: Not on file  . Highest education level: Not on file  Occupational History  . Not on file  Social Needs  . Financial resource strain: Not on file  . Food insecurity:    Worry: Not on file    Inability: Not on file  . Transportation needs:    Medical: Not on file    Non-medical: Not on file  Tobacco Use  . Smoking status: Passive Smoke Exposure - Never Smoker  . Smokeless tobacco: Never Used  . Tobacco comment: Mom smokes   Substance and Sexual Activity  . Alcohol use: No    Alcohol/week: 0.0 standard drinks  . Drug use: No  . Sexual activity: Never  Lifestyle  . Physical activity:    Days per week: Not on file    Minutes per session: Not on file  . Stress: Not on file  Relationships  . Social connections:    Talks on phone: Not on file    Gets together: Not on file  Attends religious service: Not on file    Active member of club or organization: Not on file    Attends meetings of clubs or organizations: Not on file    Relationship status: Not on file  Other Topics Concern  . Not on file  Social History Narrative   [name] graduated from [employer] in 2008. [name] enjoys washing dishes, vacuuming, playing on the computer, and going to church.   [name] lives with his mother. He has 1 older and  1 younger siblings that do not reside in the home.     Allergies Allergies  Allergen Reactions  . Other Anaphylaxis    Shell Fish    Physical Exam BP 120/90   Pulse 64   Ht 4' 11.5" (1.511 m)   Wt 152 lb 3.2 oz (69 kg)   BMI 30.23 kg/m  General: well developed, well nourished man, seated in exam room, in no evident distress; brown hair, brown eyes, right handed Head: large for size, brachycephalic and atraumatic. Oropharynx benign. No dysmorphic features. Has poor dentition Neck: supple with no carotid bruits. No focal tenderness. Cardiovascular: regular rate and rhythm, no murmurs. Respiratory: Clear to auscultation bilaterally Abdomen: Bowel sounds present all four quadrants, abdomen soft, non-tender, non-distended. Musculoskeletal: No skeletal deformities or obvious scoliosis Skin: no rashes or neurocutaneous lesions  Neurologic Exam Mental Status: Awake and fully alert.  Attention span, concentration, and fund of knowledge subnormal for age.  Speech with slight dysarthria.  Had difficulty answering questions and looked to his mother for help. Able to follow commands and participate in examination. Cranial Nerves: Fundoscopic exam - red reflex present.  Unable to fully visualize fundus.  Pupils equal briskly reactive to light.  Extraocular movements full without nystagmus.  Visual fields full to confrontation.  Hearing intact and symmetric to whisper.  Facial sensation intact.  Face, tongue, palate move normally and symmetrically.  Neck flexion and extension normal. Motor: Normal bulk and tone.  Normal strength in all tested extremity muscles. Sensory: Intact to touch and temperature in all extremities. Coordination: Rapid movements: finger and toe tapping normal and symmetric bilaterally.  Finger-to-nose and heel-to-shin intact bilaterally.  Able to balance on either foot. Romberg negative. Gait and Station: Arises from chair, without difficulty. Stance is normal.  Gait  demonstrates normal stride length and balance. Able to walk normally. Able to heel, toe and tandem walk without difficulty. Reflexes: Diminished and symmetric. Toes downgoing. No clonus.  Impression 1.  Generalized seizure disorder 2.  History of congenital hydrocephalus with VP shunt 3.  Migraine without aura 4.  Chronic tension headaches 5.  Insufficient sleep pattern 6.  History of episodes of angry mood 7.  Intellectual delay  Recommendations for plan of care The patient's previous [facility] records were reviewed. Quaid has neither had nor required imaging or lab studies since the last visit. He is a 32 year old man with history of generalized seizure disorder, congenital hydrocephalus with VP shunt, migraine and tension headaches, episodes of angry mood and intellectual delay. He is taking and tolerating Divalproex, Levetiracetam, and Vimpat for his seizure disorder. He is taking and tolerating Propranolol for migraine prevention. Randall Sours is doing well at this time and I will make no changes in his treatment plan. I instructed his mother to call me if his seizures or migraines become more frequent or more severe. I will otherwise see him back in follow up in 6 months or sooner if needed.   The medication list was reviewed and  reconciled.  No changes were made in the prescribed medications today.  A complete medication list was provided to his mother.  Allergies as of 06/01/2018      Reactions   Other Anaphylaxis   Shell Fish      Medication List        Accurate as of 06/01/18  9:12 PM. Always use your most recent med list.          acetaminophen 500 MG tablet Commonly known as:  TYLENOL Take 500 mg by mouth every 4 (four) hours as needed.   divalproex 500 MG 24 hr tablet Commonly known as:  DEPAKOTE ER Take 1 tablet (500 mg total) by mouth 3 (three) times daily.   levETIRAcetam 500 MG tablet Commonly known as:  KEPPRA TAKE 3 TABLETS EVERY MORNING AND 3 TABLETS EVERY EVENING     Melatonin 3 MG Tabs Take 1 tablet at supper for 1 week, then take 2 tablets at supper   PROAIR HFA 108 (90 Base) MCG/ACT inhaler Generic drug:  albuterol Inhale 2 puffs into the lungs every 4 (four) hours as needed. Take 2 puffs by mouth as needed for wheezing   propranolol 10 MG tablet Commonly known as:  INDERAL TAKE 3 TABLETS BY MOUTH AT BEDTIME   VIMPAT 100 MG Tabs Generic drug:  Lacosamide TAKE 1 TABLET BY MOUTH EVERY MORNING AND EVERY EVENING   VIMPAT 50 MG Tabs tablet Generic drug:  lacosamide TAKE 1 TABLET BY MOUTH EACH EVENING        Total time spent with the patient was 20 minutes, of which 50% or more was spent in counseling and coordination of care.   Elveria Rising NP-C

## 2018-12-19 ENCOUNTER — Encounter (INDEPENDENT_AMBULATORY_CARE_PROVIDER_SITE_OTHER): Payer: Self-pay | Admitting: Family

## 2018-12-23 ENCOUNTER — Other Ambulatory Visit (INDEPENDENT_AMBULATORY_CARE_PROVIDER_SITE_OTHER): Payer: Self-pay | Admitting: Family

## 2018-12-23 DIAGNOSIS — G40309 Generalized idiopathic epilepsy and epileptic syndromes, not intractable, without status epilepticus: Secondary | ICD-10-CM

## 2019-01-01 ENCOUNTER — Telehealth (INDEPENDENT_AMBULATORY_CARE_PROVIDER_SITE_OTHER): Payer: Medicaid Other | Admitting: Family

## 2019-01-01 DIAGNOSIS — G44219 Episodic tension-type headache, not intractable: Secondary | ICD-10-CM

## 2019-01-01 DIAGNOSIS — F819 Developmental disorder of scholastic skills, unspecified: Secondary | ICD-10-CM

## 2019-01-01 DIAGNOSIS — G43009 Migraine without aura, not intractable, without status migrainosus: Secondary | ICD-10-CM | POA: Diagnosis not present

## 2019-01-01 DIAGNOSIS — G40309 Generalized idiopathic epilepsy and epileptic syndromes, not intractable, without status epilepticus: Secondary | ICD-10-CM

## 2019-01-01 DIAGNOSIS — G911 Obstructive hydrocephalus: Secondary | ICD-10-CM | POA: Diagnosis not present

## 2019-01-01 DIAGNOSIS — F39 Unspecified mood [affective] disorder: Secondary | ICD-10-CM

## 2019-01-01 MED ORDER — VIMPAT 100 MG PO TABS: ORAL_TABLET | ORAL | 5 refills | Status: DC

## 2019-01-01 MED ORDER — LEVETIRACETAM 500 MG PO TABS: ORAL_TABLET | ORAL | 5 refills | Status: DC

## 2019-01-01 MED ORDER — DIVALPROEX SODIUM ER 500 MG PO TB24: 500.0000 mg | ORAL_TABLET | Freq: Three times a day (TID) | ORAL | 5 refills | Status: DC

## 2019-01-01 MED ORDER — VIMPAT 50 MG PO TABS: ORAL_TABLET | ORAL | 5 refills | Status: DC

## 2019-01-01 MED ORDER — PROPRANOLOL HCL 10 MG PO TABS: ORAL_TABLET | ORAL | 5 refills | Status: DC

## 2019-01-01 NOTE — Progress Notes (Signed)
This is a Pediatric Specialist E-Visit follow up consult provided via  Telephone Lin Givens and their parent/guardian Mliss Sax  consented to an E-Visit consult today.  Location of patient: [place] is at Home Location of provider: Damita Dunnings is at the office Patient was referred by Janece Canterbury, MD   The following participants were involved in this E-Visit: mother, CMA, and nurse practitioner  Chief Complain/ Reason for E-Visit today: No concerns Total time on call: 6 minutes Follow up: 5 months     Randall Anderson   MRN:  [id number]  08-13-1986   Provider: Elveria Rising NP-C Location of Care: [facility]  Visit type:  Routine visit Last visit: 06/01/18   Referral source: [facility] History from: Mother and [facility] chart  Brief history:  History of congenital hydrocephalus treated with VP shunt, cognitive delay, migraine and tension headaches, seizures, and episodes of angry mood.  Today's concerns: Mom reports that Vu has been doing well since his last visit. He is taking and tolerating Vimpat, Divalproex ER and Levetiracetam, and has remained seizure free. He is taking Propranolol for migraine prevention and has been only had a few headaches since his last visit. Mom says that the headaches have not been severe and have been easily resolved with Tylenol. Josephanthony continues to experience frequent tension headaches but they are mild and do not require intervention.  Mom says that his mood has been good for the most part, and that he has been generally healthy since he was last seen. He has not had ER visits or other health problems.   Review of systems: Please see HPI for neurologic and other pertinent review of systems. Otherwise all other systems were reviewed and were negative.  Problem List: Patient Active Problem List   Diagnosis Date Noted  . Migraine without aura and without status migrainosus, not intractable  12/31/2015  . Delay of cognitive development 07/01/2015  . Transient alteration of awareness 07/27/2014  . Recurrent falls 07/07/2014  . Episodic tension type headache 04/17/2013  . Long-term use of high-risk medication 04/17/2013  . Obstructive hydrocephalus (HCC) 04/17/2013  . 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 Depressive disorder, not elsewhere classified 04/17/2013  . Episodic mood disorder (HCC) 04/17/2013  . Personal history of methicillin resistant Staphylococcus aureus 04/17/2013     Past Medical History:  Diagnosis Date  . Congenital hydrocephalus (HCC)   . Hx MRSA infection   . Intellectual delay   . Seizures (HCC)   . VP (ventriculoperitoneal) shunt status     Past medical history comments: See HPI Copied from previous record:  In October 2015, [name] was falling frequently. He had CT scan of the head at that time that was unchanged from prior studies. The falling episodes gradually diminished and resolved without intervention.  Surgical history: Past Surgical History:  Procedure Laterality Date  . CIRCUMCISION  1989  . VENTRICULOPERITONEAL SHUNT     VP shunt,  endoscopic surgery to open up the floor of third ventricle and have it communicate with the subarachnoid space.      Family history: family history includes Cancer in his father, maternal grandfather, and paternal grandfather; Congestive Heart Failure in his maternal grandmother; Diabetes in an other family member.   Social history: Social History   Socioeconomic History  . Marital status: Single    Spouse name: Not on file  . Number of children: Not on file  . Years of education: Not on file  . Highest education  level: Not on file  Occupational History  . Not on file  Social Needs  . Financial resource strain: Not on file  . Food insecurity:    Worry: Not on file    Inability: Not on file  . Transportation needs:    Medical: Not on file    Non-medical: Not on file  Tobacco Use   . Smoking status: Passive Smoke Exposure - Never Smoker  . Smokeless tobacco: Never Used  . Tobacco comment: Mom smokes   Substance and Sexual Activity  . Alcohol use: No    Alcohol/week: 0.0 standard drinks  . Drug use: No  . Sexual activity: Never  Lifestyle  . Physical activity:    Days per week: Not on file    Minutes per session: Not on file  . Stress: Not on file  Relationships  . Social connections:    Talks on phone: Not on file    Gets together: Not on file    Attends religious service: Not on file    Active member of club or organization: Not on file    Attends meetings of clubs or organizations: Not on file    Relationship status: Not on file  . Intimate partner violence:    Fear of current or ex partner: Not on file    Emotionally abused: Not on file    Physically abused: Not on file    Forced sexual activity: Not on file  Other Topics Concern  . Not on file  Social History Narrative   Yandiel graduated from [employer] in 2008. Ruppert enjoys washing dishes, vacuuming, playing on the computer, and going to church.   Bertus lives with his mother. He has 1 older and 1 younger siblings that do not reside in the home.       Allergies: Allergies  Allergen Reactions  . Other Anaphylaxis    Shell Fish     Immunizations:  There is no immunization history on file for this patient.   Impression: 1. Generalized convulsive epilepsy 2. History of congenital hydrocephalus treated with VP shunt 3. Migraine without aura 4. Chronic tension headaches 5. Intellectual delay 6. History of episodes of angry mood  Recommendations for plan of care: The patient's previus [facility] records were reviewed. Mate has neither had nor required imaging or lab studies since the last visit. He is a 33 year old man with history of congenital hydrocephalus treated with VP shunt, generalized convulsive epilepsy, migraine without aura, chronic tension headaches, intellectual  delay, and history of episodes of angry mood. He has remained seizure free on Vimpat, Divalproex ER and Levetiracetam. Hie has experienced only a few migraines and continues to take Propranolol for migraine prevention. He continues to have frequent tension headaches but they do not require intervention. Tyresse will continue his medication without change for now. I asked Mom to let me know if he has seizures or if his migraines become more frequent or more severe. I will see him back in follow up in 5 months or sooner if needed. Mom agreed with the plans made today.   The medication list was reviewed and reconciled. No changes were made in the prescribed medications today. A complete medication list was provided to the patient.  Allergies as of 01/01/2019      Reactions   Other Anaphylaxis   Shell Fish      Medication List       Accurate as of January 01, 2019  3:45 PM. Always use  your most recent med list.        acetaminophen 500 MG tablet Commonly known as:  TYLENOL Take 500 mg by mouth every 4 (four) hours as needed.   divalproex 500 MG 24 hr tablet Commonly known as:  DEPAKOTE ER Take 1 tablet (500 mg total) by mouth 3 (three) times daily.   levETIRAcetam 500 MG tablet Commonly known as:  KEPPRA TAKE 3 TABLETS EVERY MORNING AND 3 TABLETS EVERY EVENING   Melatonin 3 MG Tabs Take 1 tablet at supper for 1 week, then take 2 tablets at supper   ProAir HFA 108 (90 Base) MCG/ACT inhaler Generic drug:  albuterol Inhale 2 puffs into the lungs every 4 (four) hours as needed. Take 2 puffs by mouth as needed for wheezing   propranolol 10 MG tablet Commonly known as:  INDERAL TAKE 3 TABLETS BY MOUTH AT BEDTIME   Vimpat 50 MG Tabs tablet Generic drug:  lacosamide TAKE 1 TABLET BY MOUTH EVERY DAY IN THE EVENING ALONG WITH THE 100mg  TABLET   Vimpat 100 MG Tabs Generic drug:  Lacosamide TAKE 1 TABLET BY MOUTH EVERY MORNING AND EVERY EVENING       Total time spent on the phone with  Zain's mother was 6 minutes, of which 50% or more was spent in counseling and coordination of care.  Elveria Rising NP-C [facility] Ph. [phone] Fax [phone]

## 2019-01-01 NOTE — Patient Instructions (Signed)
Thank you for talking with me on the phone today.   Instructions for you until your next appointment are as follows: 1. Continue taking your medications as you have been doing 2. Remember that it is important for you to get at least 8-9 hours of sleep each night, as not getting enough sleep can trigger headaches and seizures 3. Let me know if you have any seizures, or if your headaches become more frequent or more severe 4. Please sign up for MyChart if you have not done so 5. Please plan to return for follow up in 5 months or sooner if needed.

## 2019-02-24 ENCOUNTER — Other Ambulatory Visit (INDEPENDENT_AMBULATORY_CARE_PROVIDER_SITE_OTHER): Payer: Self-pay | Admitting: Family

## 2019-02-24 DIAGNOSIS — G40309 Generalized idiopathic epilepsy and epileptic syndromes, not intractable, without status epilepticus: Secondary | ICD-10-CM

## 2019-02-26 NOTE — Telephone Encounter (Signed)
Please send to the pharmacy °

## 2019-05-28 ENCOUNTER — Other Ambulatory Visit (INDEPENDENT_AMBULATORY_CARE_PROVIDER_SITE_OTHER): Payer: Self-pay | Admitting: Family

## 2019-05-28 DIAGNOSIS — G40309 Generalized idiopathic epilepsy and epileptic syndromes, not intractable, without status epilepticus: Secondary | ICD-10-CM

## 2019-05-29 NOTE — Telephone Encounter (Signed)
Please send to the pharmacy °

## 2019-06-04 ENCOUNTER — Encounter (INDEPENDENT_AMBULATORY_CARE_PROVIDER_SITE_OTHER): Payer: Self-pay | Admitting: Family

## 2019-06-04 ENCOUNTER — Ambulatory Visit (INDEPENDENT_AMBULATORY_CARE_PROVIDER_SITE_OTHER): Payer: Medicaid Other | Admitting: Family

## 2019-06-04 ENCOUNTER — Other Ambulatory Visit: Payer: Self-pay

## 2019-06-04 VITALS — BP 128/80 | HR 96 | Ht 59.5 in | Wt 160.4 lb

## 2019-06-04 DIAGNOSIS — G44219 Episodic tension-type headache, not intractable: Secondary | ICD-10-CM

## 2019-06-04 DIAGNOSIS — F39 Unspecified mood [affective] disorder: Secondary | ICD-10-CM

## 2019-06-04 DIAGNOSIS — G43009 Migraine without aura, not intractable, without status migrainosus: Secondary | ICD-10-CM | POA: Diagnosis not present

## 2019-06-04 DIAGNOSIS — G911 Obstructive hydrocephalus: Secondary | ICD-10-CM

## 2019-06-04 DIAGNOSIS — G40309 Generalized idiopathic epilepsy and epileptic syndromes, not intractable, without status epilepticus: Secondary | ICD-10-CM | POA: Diagnosis not present

## 2019-06-04 DIAGNOSIS — F819 Developmental disorder of scholastic skills, unspecified: Secondary | ICD-10-CM

## 2019-06-04 MED ORDER — PROPRANOLOL HCL 10 MG PO TABS: ORAL_TABLET | ORAL | 5 refills | Status: DC

## 2019-06-04 MED ORDER — LEVETIRACETAM 500 MG PO TABS: ORAL_TABLET | ORAL | 5 refills | Status: DC

## 2019-06-04 MED ORDER — VIMPAT 100 MG PO TABS: ORAL_TABLET | ORAL | 5 refills | Status: DC

## 2019-06-04 MED ORDER — VIMPAT 50 MG PO TABS: ORAL_TABLET | ORAL | 5 refills | Status: DC

## 2019-06-04 MED ORDER — DIVALPROEX SODIUM ER 500 MG PO TB24: 500.0000 mg | ORAL_TABLET | Freq: Three times a day (TID) | ORAL | 5 refills | Status: DC

## 2019-06-04 NOTE — Progress Notes (Signed)
Randall Anderson   MRN:  [id number]  02/18/1986   Provider: Elveria Risingina Jazae Gandolfi NP-C [facility]  Visit type: Routine Follow-Up  Last visit: 01/01/2019  Referral source: Janece CanterburyAaron Boals, MD History from: patient, his mother and [facility] chart  Brief history:  Copied from previous record History of congenital hydrocephalus treated with VP shunt, cognitive delay, migraine and tension headaches, seizures, and episodes of angry mood. He is taking and tolerating Vimpat, Divalproex ER and Levetiracetam for his seizure disorder.   Today's concerns: Mom reports today that Randall Anderson continues to have a "small seizure" every few months. These seizures are very brief and do not require intervention. He also has intermittent headaches, usually in the setting of being upset or angry. He usually sleeps well and does not skip meals. He has occasional angry mood but Mom is usually able to redirect him. Randall Anderson has been otherwise generally healthy since he was last seen and Mom has no other health concerns for him today other than previously mentioned.   Review of systems: Please see HPI for neurologic and other pertinent review of systems. Otherwise all other systems were reviewed and were negative.  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 He had CT scan of the head at that time that was unchanged from prior studies. The falling episodes gradually diminished and resolved without intervention.  Surgical history: Past Surgical History:  Procedure Laterality Date   CIRCUMCISION  1989   VENTRICULOPERITONEAL SHUNT     VP shunt,  endoscopic surgery to open up the floor of third ventricle and have it communicate with the subarachnoid space.      Family history: family history includes Cancer in his father, maternal grandfather, and paternal grandfather; Congestive Heart Failure in his maternal grandmother; Diabetes in an other family member.   Social history: Social History   Socioeconomic History   Marital status: Single    Spouse name: Not on file   Number of children: Not on file   Years of education: Not on file   Highest education level: Not on file  Occupational History   Not on file  Social Needs   Financial resource strain: Not on file   Food insecurity    Worry: Not on file    Inability: Not on file   Transportation needs    Medical: Not on file    Non-medical: Not on file  Tobacco Use   Smoking status: Passive Smoke Exposure - Never Smoker   Smokeless tobacco: Never Used   Tobacco comment: Mom smokes   Substance and Sexual Activity   Alcohol use: No  Alcohol/week: 0.0 standard drinks   Drug use: No   Sexual activity: Never  Lifestyle   Physical activity    Days per week: Not on file    Minutes per session: Not on file   Stress: Not on file  Relationships   Social connections    Talks on phone: Not on file    Gets together: Not on file    Attends religious service: Not on file    Active member of  club or organization: Not on file    Attends meetings of clubs or organizations: Not on file    Relationship status: Not on file   Intimate partner violence    Fear of current or ex partner: Not on file    Emotionally abused: Not on file    Physically abused: Not on file    Forced sexual activity: Not on file  Other Topics Concern   Not on file  Social History Narrative   Randall Anderson graduated from [employer] in 2008. Randall Anderson enjoys washing dishes, vacuuming, playing on the computer, and going to church.   Randall Anderson lives with his mother. He has 1 older and 1 younger siblings that do not reside in the home.      Past/failed meds:   Allergies: Allergies  Allergen Reactions   Other Anaphylaxis    Shell Fish     Immunizations:  There is no immunization history on file for this patient.    Diagnostics/Screenings: 07/05/2014 - CT head wo contrast ([facility]) - significant hydrocephalus - unchanged from prior studies. No acute intracranial abnormalities.    Physical Exam: BP 128/80    Pulse 96    Ht 4' 11.5" (1.511 m)    Wt 160 lb 6.4 oz (72.8 kg)    BMI 31.85 kg/m   General: well developed, well nourished man with short stature, seated on exam table, in no evident distress; brown hair, brown eyes, right handed Head:large for his seizure, brachycephalic and atraumatic. Oropharynx benign. No dysmorphic features. Has poor dentition Neck: supple with no carotid bruits. No focal tenderness. Cardiovascular: regular rate and rhythm, no murmurs. Respiratory: Clear to auscultation bilaterally Abdomen: Bowel sounds present all four quadrants, abdomen soft, non-tender, non-distended.  Musculoskeletal: No skeletal deformities or obvious scoliosis Skin: no rashes or neurocutaneous lesions  Neurologic Exam Mental Status: Awake and fully alert.  Attention span, concentration, and fund of knowledge subnormal for age.  Speech with slight dysarthria.  Had difficulty answering  questions and had to look to his mother for help. Able to follow simple commands and participate in examination. Cranial Nerves: Fundoscopic exam - red reflex present.  Unable to fully visualize fundus.  Pupils equal briskly reactive to light.  Extraocular movements full without nystagmus.  Visual fields full to confrontation.  Hearing intact and symmetric to whisper from his mother.  Facial sensation intact.  Face, tongue, palate move normally and symmetrically.  Neck flexion and extension normal. Motor: Normal bulk and tone.  Normal strength in all tested extremity muscles. Sensory: Intact to touch and temperature in all extremities. Coordination: Rapid movements: finger and toe tapping clumsy and symmetric bilaterally.  Finger-to-nose and heel-to-shin intact bilaterally. Had difficulty balancing on one foot. Romberg negative. Gait and Station: Arises from chair, without difficulty. Stance is normal.  Gait is wide based but fairly normal stride length and balance. Tends to walk quickly and on his toes but can get his heels down. Able to heel and toe walk without difficulty but unable to do  tandem walk.. Reflexes: Diminished and symmetric. Toes downgoing. No clonus.   Impression: 1. Generalized convulsive epilepsy 2. History of convulsive hydrocephalus treated with VP shunt 3. Migraine without aura 4. Chronic tension headaches 5. Intellectual delay 6. Episodes of angry mood.   Recommendations for plan of care: The patient's previous [facility] records were reviewed. Felix has neither had nor required imaging or lab studies since the last visit. He is a 33 year old man with history of congenital hydrocephalus treated with VP shunt, generalized convulsive epilepsy, migraine and tension headaches, intellectual delay and episodes of angry mood. He is taking and tolerating Vimap, Divalproex ER and Levetiracetam and has not experienced convulsive seizures since his last visit. He has occasional brief  staring seizures that have not been problematic. He takes Prorpranolol for migraine prevention and will continue this medication for now. He tends to have more tension headaches, particularly when upset or angry. I asked Mom to let me know if he has convulsive seizures. I will see Jaedon back in follow up in 6 months or sooner if needed. Mom agreed with the plans made today.   The medication list was reviewed and reconciled. No changes were made in the prescribed medications today. A complete medication list was provided to the patient.  Allergies as of 06/04/2019      Reactions   Other Anaphylaxis   Shell Fish      Medication List       Accurate as of June 04, 2019  9:25 AM. If you have any questions, ask your nurse or doctor.        acetaminophen 500 MG tablet Commonly known as: TYLENOL Take 500 mg by mouth every 4 (four) hours as needed.   divalproex 500 MG 24 hr tablet Commonly known as: DEPAKOTE ER Take 1 tablet (500 mg total) by mouth 3 (three) times daily.   levETIRAcetam 500 MG tablet Commonly known as: KEPPRA TAKE 3 TABLETS EVERY MORNING AND 3 TABLETS EVERY EVENING   Melatonin 3 MG Tabs Take 1 tablet at supper for 1 week, then take 2 tablets at supper   ProAir HFA 108 (90 Base) MCG/ACT inhaler Generic drug: albuterol Inhale 2 puffs into the lungs every 4 (four) hours as needed. Take 2 puffs by mouth as needed for wheezing   propranolol 10 MG tablet Commonly known as: INDERAL TAKE 3 TABLETS BY MOUTH AT BEDTIME   Vimpat 100 MG Tabs Generic drug: Lacosamide TAKE 1 TABLET BY MOUTH EVERY MORNING AND 1 TABLET EVERY EVENING   Vimpat 50 MG Tabs tablet Generic drug: lacosamide TAKE 1 TABLET BY MOUTH EVERY DAY IN THE EVENING       Total time spent with the patient was 20 minutes, of which 50% or more was spent in counseling and coordination of care.  Rockwell Germany NP-C [place] [facility] Ph. [phone] Fax [phone]

## 2019-06-06 ENCOUNTER — Encounter (INDEPENDENT_AMBULATORY_CARE_PROVIDER_SITE_OTHER): Payer: Self-pay | Admitting: Family

## 2019-06-06 NOTE — Patient Instructions (Signed)
Thank you for coming in today.   Instructions for you until your next appointment are as follows: 1. Continue taking your seizure medication as you have been doing 2. Let me know if you have any convulsive seizures 3. Let me know if migraines become more frequent or more severe.  4. Remember that it is important to get at least 8-9 hours of sleep each night, to avoid skipping meals, to drink plenty of water each day and to manage stress as these things are known to reduce headaches.  5. Please sign up for MyChart if you have not done so 6. Please plan to return for follow up in 6 months or sooner if needed.

## 2019-07-28 ENCOUNTER — Other Ambulatory Visit (INDEPENDENT_AMBULATORY_CARE_PROVIDER_SITE_OTHER): Payer: Self-pay | Admitting: Family

## 2019-07-28 DIAGNOSIS — G40309 Generalized idiopathic epilepsy and epileptic syndromes, not intractable, without status epilepticus: Secondary | ICD-10-CM

## 2019-08-01 ENCOUNTER — Other Ambulatory Visit (INDEPENDENT_AMBULATORY_CARE_PROVIDER_SITE_OTHER): Payer: Self-pay | Admitting: Family

## 2019-08-01 DIAGNOSIS — G40309 Generalized idiopathic epilepsy and epileptic syndromes, not intractable, without status epilepticus: Secondary | ICD-10-CM

## 2019-08-01 MED ORDER — VIMPAT 100 MG PO TABS: ORAL_TABLET | ORAL | 5 refills | Status: DC

## 2019-08-01 MED ORDER — VIMPAT 50 MG PO TABS: ORAL_TABLET | ORAL | 5 refills | Status: DC

## 2019-09-30 ENCOUNTER — Other Ambulatory Visit (INDEPENDENT_AMBULATORY_CARE_PROVIDER_SITE_OTHER): Payer: Self-pay | Admitting: Family

## 2019-09-30 DIAGNOSIS — G40309 Generalized idiopathic epilepsy and epileptic syndromes, not intractable, without status epilepticus: Secondary | ICD-10-CM

## 2019-10-01 NOTE — Telephone Encounter (Signed)
Please send to the pharmacy °

## 2019-10-31 ENCOUNTER — Other Ambulatory Visit (INDEPENDENT_AMBULATORY_CARE_PROVIDER_SITE_OTHER): Payer: Self-pay | Admitting: Family

## 2019-10-31 DIAGNOSIS — G40309 Generalized idiopathic epilepsy and epileptic syndromes, not intractable, without status epilepticus: Secondary | ICD-10-CM

## 2019-11-01 NOTE — Telephone Encounter (Signed)
Please send to the pharmacy °

## 2019-11-07 ENCOUNTER — Other Ambulatory Visit: Payer: Self-pay

## 2019-11-07 ENCOUNTER — Ambulatory Visit (INDEPENDENT_AMBULATORY_CARE_PROVIDER_SITE_OTHER): Payer: Medicaid Other | Admitting: Family

## 2019-11-07 ENCOUNTER — Encounter (INDEPENDENT_AMBULATORY_CARE_PROVIDER_SITE_OTHER): Payer: Self-pay | Admitting: Family

## 2019-11-07 DIAGNOSIS — F39 Unspecified mood [affective] disorder: Secondary | ICD-10-CM

## 2019-11-07 DIAGNOSIS — G40309 Generalized idiopathic epilepsy and epileptic syndromes, not intractable, without status epilepticus: Secondary | ICD-10-CM

## 2019-11-07 DIAGNOSIS — G43009 Migraine without aura, not intractable, without status migrainosus: Secondary | ICD-10-CM

## 2019-11-07 DIAGNOSIS — G911 Obstructive hydrocephalus: Secondary | ICD-10-CM

## 2019-11-07 DIAGNOSIS — F819 Developmental disorder of scholastic skills, unspecified: Secondary | ICD-10-CM

## 2019-11-07 DIAGNOSIS — G44219 Episodic tension-type headache, not intractable: Secondary | ICD-10-CM | POA: Diagnosis not present

## 2019-11-07 MED ORDER — LEVETIRACETAM 500 MG PO TABS: ORAL_TABLET | ORAL | 5 refills | Status: DC

## 2019-11-07 MED ORDER — VIMPAT 100 MG PO TABS: ORAL_TABLET | ORAL | 5 refills | Status: DC

## 2019-11-07 MED ORDER — VIMPAT 50 MG PO TABS: ORAL_TABLET | ORAL | 5 refills | Status: DC

## 2019-11-07 MED ORDER — PROPRANOLOL HCL 10 MG PO TABS: ORAL_TABLET | ORAL | 5 refills | Status: DC

## 2019-11-07 MED ORDER — DIVALPROEX SODIUM ER 500 MG PO TB24: 500.0000 mg | ORAL_TABLET | Freq: Three times a day (TID) | ORAL | 5 refills | Status: DC

## 2019-11-07 NOTE — Progress Notes (Signed)
This is a Pediatric Specialist E-Visit follow up consult provided via Telephone Lin Givens and their parent/guardian Mliss Sax consented to an E-Visit consult today.  Location of patient: Randall Anderson is at home Location of provider: Elveria Rising, NP is in ofice Patient was referred by Janece Canterbury, MD   The following participants were involved in this E-Visit: mother, patient, CMA, provider  Chief Complain/ Reason for E-Visit today: Headaches/Seizures Total time on call: 10 min Follow up: 6 months     Randall Anderson   MRN:  [id number]  [birth year]   Provider: Elveria Rising NP-C Location of Care: [facility]  Visit type: Telehealth visit  Last visit: 06/04/2019  Referral source: Janece Canterbury, MD History from: patient, mother, and ch[facility] chart  Brief history:  Copied from previous record: History of congenital hydrocephalus treated with VP shunt, cognitive delay, migraine and tension headaches, seizures, and episodes of angry mood. He is taking and tolerating Vimpat, Divalproex ER and Levetiracetam for his seizure disorder.   Today's concerns:  Mom reports today that Randall Anderson has had a few brief seizures recently. She believes that the seizures were triggered by emotional upset as they experienced a death in their family. Mom reports that Randall Anderson complains of a headache every 3-4 days but that they are typically not severe. She says that he has been at home due to Covid 19 pandemic and sometimes gets frustrated with restrictions. He has been otherwise generally healthy since he was last seen. Mom has no other health concerns for him today other than previously mentioned.   Review of systems: Please see HPI for neurologic and other pertinent review of systems. Otherwise all other systems were reviewed and were negative.  Problem List: Patient Active Problem List   Diagnosis Date Noted  . Migraine without aura and without status migrainosus, not  intractable 12/31/2015  . Delay of cognitive development 07/01/2015  . Transient alteration of awareness 07/27/2014  . Recurrent falls 07/07/2014  . Episodic tension type headache 04/17/2013  . Long-term use of high-risk medication 04/17/2013  . Obstructive hydrocephalus (HCC) 04/17/2013  . 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 Depressive disorder, not elsewhere classified 04/17/2013  . Episodic mood disorder (HCC) 04/17/2013  . Personal history of methicillin resistant Staphylococcus aureus 04/17/2013     Past Medical History:  Diagnosis Date  . Congenital hydrocephalus (HCC)   . Hx MRSA infection   . Intellectual delay   . Seizures (HCC)   . VP (ventriculoperitoneal) shunt status     Past medical history comments: See HPI Copied from previous record: In October 2015, Randall Anderson was falling frequently. He had CT scan of the head at that time that was unchanged from prior studies. The falling episodes gradually diminished and resolved without intervention.  Surgical history: Past Surgical History:  Procedure Laterality Date  . CIRCUMCISION  1989  . VENTRICULOPERITONEAL SHUNT     VP shunt,  endoscopic surgery to open up the floor of third ventricle and have it communicate with the subarachnoid space.      Family history: family history includes Cancer in his father, maternal grandfather, and paternal grandfather; Congestive Heart Failure in his maternal grandmother; Diabetes in an other family member.   Social history: Social History   Socioeconomic History  . Marital status: Single    Spouse name: Not on file  . Number of children: Not on file  . Years of education: Not on file  . Highest education level: Not on file  Occupational  History  . Not on file  Tobacco Use  . Smoking status: Passive Smoke Exposure - Never Smoker  . Smokeless tobacco: Never Used  . Tobacco comment: Mom smokes   Substance and Sexual Activity  . Alcohol use: No    Alcohol/week: 0.0  standard drinks  . Drug use: No  . Sexual activity: Never  Other Topics Concern  . Not on file  Social History Narrative   Darivs graduated from [employer] in 2008. Randall Anderson enjoys washing dishes, vacuuming, playing on the computer, and going to church.   Randall Anderson lives with his mother. He has 1 older and 1 younger siblings that do not reside in the home.    Social Determinants of Health   Financial Resource Strain:   . Difficulty of Paying Living Expenses: Not on file  Food Insecurity:   . Worried About Charity fundraiser in the Last Year: Not on file  . Ran Out of Food in the Last Year: Not on file  Transportation Needs:   . Lack of Transportation (Medical): Not on file  . Lack of Transportation (Non-Medical): Not on file  Physical Activity:   . Days of Exercise per Week: Not on file  . Minutes of Exercise per Session: Not on file  Stress:   . Feeling of Stress : Not on file  Social Connections:   . Frequency of Communication with Friends and Family: Not on file  . Frequency of Social Gatherings with Friends and Family: Not on file  . Attends Religious Services: Not on file  . Active Member of Clubs or Organizations: Not on file  . Attends Archivist Meetings: Not on file  . Marital Status: Not on file  Intimate Partner Violence:   . Fear of Current or Ex-Partner: Not on file  . Emotionally Abused: Not on file  . Physically Abused: Not on file  . Sexually Abused: Not on file     Past/failed meds:   Allergies: Allergies  Allergen Reactions  . Other Anaphylaxis    Shell Fish     Immunizations:  There is no immunization history on file for this patient.    Diagnostics/Screenings: 07/05/2014 - CT head wo contrast ([facility]) - significant hydrocephalus - unchanged from prior studies. No acute intracranial abnormalities.    Physical Exam: There were no vitals taken for this visit.  There was no examination as this was a telephone  visit.  Impression: 1. Generalized convulsive epilepsy 2. History of obstructive hydrocephalus treated with VP shunt 3. Migraine without aura 4. Chronic tension headache 5. Intellectual delay 6. Episodes of angry mood  Recommendations for plan of care: The patient's previous [facility] records were reviewed. Roch has neither had nor required imaging or lab studies since the last visit. He is a 34 year old man with history of generalized convulsive epilepsy, obstructive hydrocephalus treated with VP shunt, migraine and tension headaches, intellectual delay and episodes of angry mood. He is taking and tolerating Vimpat, Divalproex ER and Levetiracetam for his seizure disorder. He has experienced a few brief seizures recently in the setting of emotional upset. He has headaches every few days but they are not severe. I talked with Mom and recommended that we continue his medication without change for now. I asked her to let me know if he has more seizures or if the headaches become more frequent or more severe. I will otherwise see him back in follow up in 6 months or sooner if needed. Mom  agreed with the plans made today.   The medication list was reviewed and reconciled. No changes were made in the prescribed medications today. A complete medication list was provided to the patient.  Allergies as of 11/07/2019      Reactions   Other Anaphylaxis   Shell Fish      Medication List       Accurate as of November 07, 2019 10:34 AM. If you have any questions, ask your nurse or doctor.        acetaminophen 500 MG tablet Commonly known as: TYLENOL Take 500 mg by mouth every 4 (four) hours as needed.   divalproex 500 MG 24 hr tablet Commonly known as: DEPAKOTE ER Take 1 tablet (500 mg total) by mouth 3 (three) times daily.   levETIRAcetam 500 MG tablet Commonly known as: KEPPRA TAKE 3 TABLETS EVERY MORNING AND 3 TABLETS EVERY EVENING   Melatonin 3 MG Tabs Take 1 tablet at supper for 1 week,  then take 2 tablets at supper   ProAir HFA 108 (90 Base) MCG/ACT inhaler Generic drug: albuterol Inhale 2 puffs into the lungs every 4 (four) hours as needed. Take 2 puffs by mouth as needed for wheezing   propranolol 10 MG tablet Commonly known as: INDERAL TAKE 3 TABLETS BY MOUTH AT BEDTIME   Vimpat 100 MG Tabs Generic drug: Lacosamide TAKE 1 TABLET BY MOUTH EVERY MORNING AND 1 TABLET EVERY EVENING   Vimpat 50 MG Tabs tablet Generic drug: lacosamide TAKE 1 TABLET BY MOUTH EVERY DAY IN THE EVENING       Total time spent on the phone with the patient's mother was 10 minutes, of which 50% or more was spent in counseling and coordination of care.  Elveria Rising NP-C [facility] Child Neurology Ph. [phone] Fax [phone]

## 2019-11-09 ENCOUNTER — Encounter (INDEPENDENT_AMBULATORY_CARE_PROVIDER_SITE_OTHER): Payer: Self-pay | Admitting: Family

## 2019-11-09 NOTE — Patient Instructions (Signed)
Thank you for talking with me by phone today.   Instructions for you until your next appointment are as follows: 1. Continue taking your medications as you have been doing 2. Let me know if your seizures or headaches become more frequent or more severe 3. Please sign up for MyChart if you have not done so 4. Please plan to return for follow up in 6 months or sooner if needed.

## 2020-01-04 ENCOUNTER — Other Ambulatory Visit (INDEPENDENT_AMBULATORY_CARE_PROVIDER_SITE_OTHER): Payer: Self-pay | Admitting: Family

## 2020-01-04 DIAGNOSIS — G40309 Generalized idiopathic epilepsy and epileptic syndromes, not intractable, without status epilepticus: Secondary | ICD-10-CM

## 2020-02-02 ENCOUNTER — Other Ambulatory Visit (INDEPENDENT_AMBULATORY_CARE_PROVIDER_SITE_OTHER): Payer: Self-pay | Admitting: Family

## 2020-02-02 DIAGNOSIS — G40309 Generalized idiopathic epilepsy and epileptic syndromes, not intractable, without status epilepticus: Secondary | ICD-10-CM

## 2020-02-04 NOTE — Telephone Encounter (Signed)
Please send to the pharmacy °

## 2020-03-05 ENCOUNTER — Other Ambulatory Visit (INDEPENDENT_AMBULATORY_CARE_PROVIDER_SITE_OTHER): Payer: Self-pay | Admitting: Family

## 2020-03-05 DIAGNOSIS — G40309 Generalized idiopathic epilepsy and epileptic syndromes, not intractable, without status epilepticus: Secondary | ICD-10-CM

## 2020-03-05 MED ORDER — VIMPAT 100 MG PO TABS: ORAL_TABLET | ORAL | 5 refills | Status: DC

## 2020-03-05 NOTE — Telephone Encounter (Signed)
Please send to the pharmacy °

## 2020-04-03 ENCOUNTER — Other Ambulatory Visit (INDEPENDENT_AMBULATORY_CARE_PROVIDER_SITE_OTHER): Payer: Self-pay | Admitting: Family

## 2020-04-03 DIAGNOSIS — G40309 Generalized idiopathic epilepsy and epileptic syndromes, not intractable, without status epilepticus: Secondary | ICD-10-CM

## 2020-04-03 NOTE — Telephone Encounter (Signed)
Please send to the pharmacy °

## 2020-04-18 ENCOUNTER — Other Ambulatory Visit: Payer: Self-pay

## 2020-04-18 ENCOUNTER — Encounter (INDEPENDENT_AMBULATORY_CARE_PROVIDER_SITE_OTHER): Payer: Self-pay | Admitting: Family

## 2020-04-18 ENCOUNTER — Ambulatory Visit (INDEPENDENT_AMBULATORY_CARE_PROVIDER_SITE_OTHER): Payer: Medicaid Other | Admitting: Family

## 2020-04-18 VITALS — BP 124/82 | HR 120 | Ht 59.5 in | Wt 164.0 lb

## 2020-04-18 DIAGNOSIS — G40309 Generalized idiopathic epilepsy and epileptic syndromes, not intractable, without status epilepticus: Secondary | ICD-10-CM

## 2020-04-18 DIAGNOSIS — G44219 Episodic tension-type headache, not intractable: Secondary | ICD-10-CM | POA: Diagnosis not present

## 2020-04-18 DIAGNOSIS — F39 Unspecified mood [affective] disorder: Secondary | ICD-10-CM

## 2020-04-18 DIAGNOSIS — G43009 Migraine without aura, not intractable, without status migrainosus: Secondary | ICD-10-CM | POA: Diagnosis not present

## 2020-04-18 DIAGNOSIS — G911 Obstructive hydrocephalus: Secondary | ICD-10-CM

## 2020-04-18 DIAGNOSIS — F819 Developmental disorder of scholastic skills, unspecified: Secondary | ICD-10-CM

## 2020-04-18 NOTE — Progress Notes (Signed)
Randall Anderson   MRN:  [id number]  1986-01-20   Provider: Elveria Rising NP-C Location of Care: [facility] Neurology  Visit type: Routine Follow-Up  Last visit: 11/07/2019  Referral source: Janece Canterbury, MD History from: patient, [facility] chart, mother  Brief history:  Copied from previous record: History of congenital hydrocephalus treated with VP shunt, cognitive delay, migraine and tension headaches, seizures, and episodes of angry mood.He is taking and tolerating Vimpat, Divalproex ER and Levetiracetam for his seizure disorder.  Today's concerns: Mom reports today that Randall Anderson has remained seizure free since his last visit. He is complaint with medication and generally gets at least 8 hours of sleep each night. She reports that Randall Anderson continues to have frequent tension headaches and occasionally has a migraine. These headaches are typically relieved by rest.   Mom wonders if it is safe for Randall Anderson to get a Covid-19 vaccine with his history of seizures. He has been otherwise generally healthy and Mom has no other health concerns for him today other than previously mentioned.   Review of systems: Please see HPI for neurologic and other pertinent review of systems. Otherwise all other systems were reviewed and were negative.  Problem List: Patient Active Problem List   Diagnosis Date Noted  . Migraine without aura and without status migrainosus, not intractable 12/31/2015  . Delay of cognitive development 07/01/2015  . Transient alteration of awareness 07/27/2014  . Recurrent falls 07/07/2014  . Episodic tension type headache 04/17/2013  . Long-term use of high-risk medication 04/17/2013  . Obstructive hydrocephalus (HCC) 04/17/2013  . 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 Depressive disorder, not elsewhere classified 04/17/2013  . Episodic mood disorder (HCC) 04/17/2013  . Personal history of methicillin resistant Staphylococcus aureus 04/17/2013       Past Medical History:  Diagnosis Date  . Congenital hydrocephalus (HCC)   . Hx MRSA infection   . Intellectual delay   . Seizures (HCC)   . VP (ventriculoperitoneal) shunt status     Past medical history comments: See HPI Copied from previous record: In October 2015, Randall Anderson was falling frequently. He had CT scan of the head at that time that was unchanged from prior studies. The falling episodes gradually diminished and resolved without intervention.  Surgical history: Past Surgical History:  Procedure Laterality Date  . CIRCUMCISION  1989  . VENTRICULOPERITONEAL SHUNT     VP shunt,  endoscopic surgery to open up the floor of third ventricle and have it communicate with the subarachnoid space.      Family history: family history includes Cancer in his father, maternal grandfather, and paternal grandfather; Congestive Heart Failure in his maternal grandmother; Diabetes in an other family member.   Social history: Social History   Socioeconomic History  . Marital status: Single    Spouse name: Not on file  . Number of children: Not on file  . Years of education: Not on file  . Highest education level: Not on file  Occupational History  . Not on file  Tobacco Use  . Smoking status: Passive Smoke Exposure - Never Smoker  . Smokeless tobacco: Never Used  . Tobacco comment: Mom smokes   Substance and Sexual Activity  . Alcohol use: No    Alcohol/week: 0.0 standard drinks  . Drug use: No  . Sexual activity: Never  Other Topics Concern  . Not on file  Social History Narrative   Randall Anderson graduated from [employer] in 2008. Randall Anderson enjoys washing dishes, vacuuming, playing  on the computer, and going to church.   Randall Anderson lives with his mother. He has 1 older and 1 younger siblings that do not reside in the home.    Social Determinants of Health   Financial Resource Strain:   . Difficulty of Paying Living Expenses:   Food Insecurity:   . Worried About Community education officer in the Last Year:   . Barista in the Last Year:   Transportation Needs:   . Freight forwarder (Medical):   [name] Kitchen Lack of Transportation (Non-Medical):   Physical Activity:   . Days of Exercise per Week:   . Minutes of Exercise per Session:   Stress:   . Feeling of Stress :   Social Connections:   . Frequency of Communication with Friends and Family:   . Frequency of Social Gatherings with Friends and Family:   . Attends Religious Services:   . Active Member of Clubs or Organizations:   . Attends Banker Meetings:   [name] Kitchen Marital Status:   Intimate Partner Violence:   . Fear of Current or Ex-Partner:   . Emotionally Abused:   [name] Kitchen Physically Abused:   . Sexually Abused:     Past/failed meds:  Allergies: Allergies  Allergen Reactions  . Other Anaphylaxis    Shell Fish    Immunizations:  There is no immunization history on file for this patient.   Diagnostics/Screenings: 07/05/2014 - CT head wo contrast ([facility]) - significant hydrocephalus - unchanged from prior studies. No acute intracranial abnormalities.  Physical Exam: BP 124/82   Pulse (!) 120   Ht 4' 11.5" (1.511 m)   Wt 164 lb (74.4 kg)   BMI 32.57 kg/m   General: well developed, well nourished man, seated on exam table, in no evident distress; brown hair, brown eyes, right handed Head: large for his size, brachycephalic and atraumatic. Oropharynx benign. No dysmorphic features. Has poor dentition Neck: supple with no carotid bruits. Cardiovascular: regular rate and rhythm, no murmurs. Respiratory: Clear to auscultation bilaterally Abdomen: Bowel sounds present all four quadrants, abdomen soft, non-tender, non-distended. Musculoskeletal: No skeletal deformities or obvious scoliosis. Skin: no rashes or neurocutaneous lesions  Neurologic Exam Mental Status: Awake and fully alert. Attention span, concentration and fund of knowledge subnormal for age. Speech with slight  dysarthria. Has difficulty answering questions and looks to his mother for help. Able to follow simple commands and participate in examination.  Cranial Nerves: Fundoscopic exam - red reflex present.  Unable to fully visualize fundus.  Pupils equal briskly reactive to light.  Extraocular movements without nystagmus. Hearing intact and symmetric to whisper from his mother. Facial sensation is intact and facial movements are symmetric. Shoulder shrug normal Motor: Normal bulk tone and strength in all extremities. Sensory: Withdrawal x 4 Coordination: Rapid alternating movements normal and symmetric bilaterally. Finger to nose and heel to shin normal and symmetric. Romberg negative.  Gait and Station: Arises from chair without difficulty. Stance is normal. Gait is wide based but fairly normal stride length and balance. Tends to walk quickly and on his toes but can get his heels down. Able to heel and toe walk. Tandem walk is clumsy. Reflexes: Diminished and symmetric. Toes neutral. No clonus  Impression: 1. Generalized convulsive epilepsy 2. History of obstructive hydrocephalus treated with VP shunt 3. Migraine without aura 4. Chronic tension headache 5. Intellectual delay 6. Episodes of angry mood   Recommendations for plan of care: The patient's [facility] were reviewed.  Evert has neither had nor required imaging or lab studies since the last visit. He is a 34 year old man with history of generalized convulsive epilepsy, obstructive hydrocephalus treated with VP shunt, migraine and tension headaches, intellectual delay and episodes of angry mood. He is taking and tolerating Vimpat, Divalproex ER and Levetiracetam for his seizure disorder. He has remained seizure free since his last visit. I reminded Zayvier of the need to be compliant with medication, and to get at least  8 hours of sleep each night. We talked about the frequent tension headaches and Mom is aware that there is no  prophylactic medication for this problem. I explained that not skipping meals, drinking plenty of water and getting sufficient sleep at night can often reduce headache frequency and severity. I will see Ladarion back in follow up in 6 months or sooner if needed. Mom agreed with the plans made today.  The medication list was reviewed and reconciled. No changes were made in the prescribed medications today. A complete medication list was provided to the patient.  Allergies as of 04/18/2020      Reactions   Other Anaphylaxis   Shell Fish      Medication List       Accurate as of April 18, 2020 12:35 PM. If you have any questions, ask your nurse or doctor.        acetaminophen 500 MG tablet Commonly known as: TYLENOL Take 500 mg by mouth every 4 (four) hours as needed.   divalproex 500 MG 24 hr tablet Commonly known as: DEPAKOTE ER Take 1 tablet (500 mg total) by mouth 3 (three) times daily.   levETIRAcetam 500 MG tablet Commonly known as: KEPPRA TAKE 3 TABLETS EVERY MORNING AND 3 TABLETS EVERY EVENING   melatonin 3 MG Tabs tablet Take 1 tablet at supper for 1 week, then take 2 tablets at supper   ProAir HFA 108 (90 Base) MCG/ACT inhaler Generic drug: albuterol Inhale 2 puffs into the lungs every 4 (four) hours as needed. Take 2 puffs by mouth as needed for wheezing   propranolol 10 MG tablet Commonly known as: INDERAL TAKE 3 TABLETS BY MOUTH AT BEDTIME   Vimpat 50 MG Tabs tablet Generic drug: lacosamide TAKE 1 TABLET BY MOUTH EVERY DAY IN THE EVENING   Vimpat 100 MG Tabs Generic drug: Lacosamide TAKE 1 TABLET BY MOUTH EVERY MORNING AND 1 TABLET EVERY EVENING      Total time spent with the patient was 20 minutes, of which 50% or more was spent in counseling and coordination of care.  Elveria Rising NP-C [facility] Child Neurology Ph. [phone] Fax [phone]

## 2020-04-20 ENCOUNTER — Encounter (INDEPENDENT_AMBULATORY_CARE_PROVIDER_SITE_OTHER): Payer: Self-pay | Admitting: Family

## 2020-04-20 NOTE — Patient Instructions (Signed)
Thank you for coming in today.   Instructions for you until your next appointment are as follows: 1. Continue giving Randall Anderson the seizure medications as prescribed. Try not to miss any doses 2. Remember that headache frequency can sometimes be reduced by not skipping meals, by drinking plenty of water and by getting enough sleep each night.  3. Let me know if Randall Anderson has seizures or if his headaches become more severe.  4. Please sign up for MyChart if you have not done so 5. Please plan to return for follow up in 6 months or sooner if needed.

## 2020-04-30 ENCOUNTER — Other Ambulatory Visit (INDEPENDENT_AMBULATORY_CARE_PROVIDER_SITE_OTHER): Payer: Self-pay | Admitting: Family

## 2020-04-30 DIAGNOSIS — G40309 Generalized idiopathic epilepsy and epileptic syndromes, not intractable, without status epilepticus: Secondary | ICD-10-CM

## 2020-04-30 NOTE — Telephone Encounter (Signed)
Please send to the pharmacy °

## 2020-06-10 ENCOUNTER — Other Ambulatory Visit (INDEPENDENT_AMBULATORY_CARE_PROVIDER_SITE_OTHER): Payer: Self-pay | Admitting: Family

## 2020-06-10 DIAGNOSIS — G40309 Generalized idiopathic epilepsy and epileptic syndromes, not intractable, without status epilepticus: Secondary | ICD-10-CM

## 2020-06-11 ENCOUNTER — Other Ambulatory Visit (INDEPENDENT_AMBULATORY_CARE_PROVIDER_SITE_OTHER): Payer: Self-pay | Admitting: Family

## 2020-06-11 DIAGNOSIS — G40309 Generalized idiopathic epilepsy and epileptic syndromes, not intractable, without status epilepticus: Secondary | ICD-10-CM

## 2020-06-12 NOTE — Telephone Encounter (Signed)
Please send to the pharmacy °

## 2020-07-13 ENCOUNTER — Other Ambulatory Visit (INDEPENDENT_AMBULATORY_CARE_PROVIDER_SITE_OTHER): Payer: Self-pay | Admitting: Family

## 2020-07-13 DIAGNOSIS — G40309 Generalized idiopathic epilepsy and epileptic syndromes, not intractable, without status epilepticus: Secondary | ICD-10-CM

## 2020-07-14 NOTE — Telephone Encounter (Signed)
Please send to the pharmacy °

## 2020-11-15 ENCOUNTER — Other Ambulatory Visit (INDEPENDENT_AMBULATORY_CARE_PROVIDER_SITE_OTHER): Payer: Self-pay | Admitting: Family

## 2020-11-15 DIAGNOSIS — G40309 Generalized idiopathic epilepsy and epileptic syndromes, not intractable, without status epilepticus: Secondary | ICD-10-CM

## 2020-11-21 ENCOUNTER — Other Ambulatory Visit (INDEPENDENT_AMBULATORY_CARE_PROVIDER_SITE_OTHER): Payer: Self-pay | Admitting: Family

## 2020-11-21 DIAGNOSIS — G40309 Generalized idiopathic epilepsy and epileptic syndromes, not intractable, without status epilepticus: Secondary | ICD-10-CM

## 2020-11-21 DIAGNOSIS — G43009 Migraine without aura, not intractable, without status migrainosus: Secondary | ICD-10-CM

## 2020-12-25 ENCOUNTER — Other Ambulatory Visit (INDEPENDENT_AMBULATORY_CARE_PROVIDER_SITE_OTHER): Payer: Self-pay | Admitting: Family

## 2020-12-25 DIAGNOSIS — G40309 Generalized idiopathic epilepsy and epileptic syndromes, not intractable, without status epilepticus: Secondary | ICD-10-CM

## 2020-12-30 ENCOUNTER — Encounter (INDEPENDENT_AMBULATORY_CARE_PROVIDER_SITE_OTHER): Payer: Self-pay | Admitting: Family

## 2020-12-30 ENCOUNTER — Ambulatory Visit (INDEPENDENT_AMBULATORY_CARE_PROVIDER_SITE_OTHER): Payer: Medicaid Other | Admitting: Family

## 2020-12-30 ENCOUNTER — Other Ambulatory Visit: Payer: Self-pay

## 2020-12-30 VITALS — BP 110/82 | HR 72 | Ht 59.5 in | Wt 160.4 lb

## 2020-12-30 DIAGNOSIS — G43009 Migraine without aura, not intractable, without status migrainosus: Secondary | ICD-10-CM | POA: Diagnosis not present

## 2020-12-30 DIAGNOSIS — G44219 Episodic tension-type headache, not intractable: Secondary | ICD-10-CM | POA: Diagnosis not present

## 2020-12-30 DIAGNOSIS — G911 Obstructive hydrocephalus: Secondary | ICD-10-CM

## 2020-12-30 DIAGNOSIS — G40309 Generalized idiopathic epilepsy and epileptic syndromes, not intractable, without status epilepticus: Secondary | ICD-10-CM | POA: Diagnosis not present

## 2020-12-30 DIAGNOSIS — F819 Developmental disorder of scholastic skills, unspecified: Secondary | ICD-10-CM

## 2020-12-30 DIAGNOSIS — F39 Unspecified mood [affective] disorder: Secondary | ICD-10-CM

## 2020-12-30 MED ORDER — DIVALPROEX SODIUM ER 500 MG PO TB24: 500.0000 mg | ORAL_TABLET | Freq: Three times a day (TID) | ORAL | 3 refills | Status: DC

## 2020-12-30 MED ORDER — VIMPAT 100 MG PO TABS: ORAL_TABLET | ORAL | 5 refills | Status: DC

## 2020-12-30 MED ORDER — PROPRANOLOL HCL 10 MG PO TABS
ORAL_TABLET | ORAL | 3 refills | Status: DC
Start: 2020-12-30 — End: 2021-06-29

## 2020-12-30 MED ORDER — VIMPAT 50 MG PO TABS: ORAL_TABLET | ORAL | 5 refills | Status: DC

## 2020-12-30 NOTE — Progress Notes (Signed)
Randall Anderson   MRN:  [id number]  August 24, 1986   Provider: Elveria Rising NP-C Location of Care: [facility]  Visit type:  Routine visit  Last visit: 04/18/2020  Referral source: Janece Canterbury, MD History from: mother, patient, and chcn chart  Brief history:  Copied from previous record: History of congenital hydrocephalus treated with VP shunt, cognitive delay, migraine and tension headaches, seizures, and episodes of angry mood.He is taking and tolerating Vimpat, Divalproex ER and Levetiracetam for his seizure disorder.  Today's concerns:  Mom reports today that Randall Anderson had a seizure yesterday in the setting of missed medication (Vimpat). The seizure was brief and he was not injured during it.   Mom reports that Randall Anderson has a headache every day and that sometimes he needs Tylenol to obtain relief. The headaches are not severe and generally do not prevent him from doing activities.   Randall Anderson continues to have angry mood at times. He is not aggressive but does become hostile for short periods of time.   Randall Anderson has been otherwise generally healthy since he was last seen. Neither he nor his mother have other health concerns for him today other than previously mentioned.  Review of systems: Please see HPI for neurologic and other pertinent review of systems. Otherwise all other systems were reviewed and were negative.  Problem List: Patient Active Problem List   Diagnosis Date Noted  . Migraine without aura and without status migrainosus, not intractable 12/31/2015  . Delay of cognitive development 07/01/2015  . Transient alteration of awareness 07/27/2014  . Recurrent falls 07/07/2014  . Episodic tension type headache 04/17/2013  . Long-term use of high-risk medication 04/17/2013  . Obstructive hydrocephalus (HCC) 04/17/2013  . 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 Depressive disorder, not elsewhere classified 04/17/2013  . Episodic mood  disorder (HCC) 04/17/2013  . Personal history of methicillin resistant Staphylococcus aureus 04/17/2013     Past Medical History:  Diagnosis Date  . Congenital hydrocephalus (HCC)   . Hx MRSA infection   . Intellectual delay   . Seizures (HCC)   . VP (ventriculoperitoneal) shunt status     Past medical history comments: See HPI Copied from previous record: In October 2015, Randall Anderson was falling frequently. He had CT scan of the head at that time that was unchanged from prior studies. The falling episodes gradually diminished and resolved without intervention.  Surgical history: Past Surgical History:  Procedure Laterality Date  . CIRCUMCISION  1989  . VENTRICULOPERITONEAL SHUNT     VP shunt,  endoscopic surgery to open up the floor of third ventricle and have it communicate with the subarachnoid space.      Family history: family history includes Cancer in his father, maternal grandfather, and paternal grandfather; Congestive Heart Failure in his maternal grandmother; Diabetes in an other family member.   Social history: Social History   Socioeconomic History  . Marital status: Single    Spouse name: Not on file  . Number of children: Not on file  . Years of education: Not on file  . Highest education level: Not on file  Occupational History  . Not on file  Tobacco Use  . Smoking status: Passive Smoke Exposure - Never Smoker  . Smokeless tobacco: Never Used  . Tobacco comment: Mom smokes   Substance and Sexual Activity  . Alcohol use: No    Alcohol/week: 0.0 standard drinks  . Drug use: No  . Sexual activity: Never  Other Topics Concern  .  Not on file  Social History Narrative   Randall Anderson graduated from [employer] in 2008. Randall Anderson enjoys washing dishes, vacuuming, playing on the computer, and going to church.   Randall Anderson lives with his mother. He has 1 older and 1 younger siblings that do not reside in the home.    Social Determinants of Health   Financial  Resource Strain: Not on file  Food Insecurity: Not on file  Transportation Needs: Not on file  Physical Activity: Not on file  Stress: Not on file  Social Connections: Not on file  Intimate Partner Violence: Not on file    Past/failed meds:  Allergies: Allergies  Allergen Reactions  . Other Anaphylaxis    Shell Fish     Immunizations:  There is no immunization history on file for this patient.    Diagnostics/Screenings: Copied from previous record: 07/05/2014 - CT head wo contrast ([facility]) - significant hydrocephalus - unchanged from prior studies. No acute intracranial abnormalities.  Physical Exam: BP 110/82   Pulse 72   Ht 4' 11.5" (1.511 m)   Wt 160 lb 6.4 oz (72.8 kg)   BMI 31.85 kg/m   General: Short statured but well developed, well nourished man, seated on exam table, in no evident distress, brown hair, brown eyes, right handed Head: Head normocephalic and atraumatic.  Oropharynx benign. Neck: Supple Cardiovascular: Regular rate and rhythm, no murmurs Respiratory: Breath sounds clear to auscultation Musculoskeletal: No obvious deformities or scoliosis Skin: No rashes or neurocutaneous lesions  Neurologic Exam Mental Status: Awake and fully alert.  Oriented to place and time. Attention span, concentration, and fund of knowledge subnormal for age.  Mood and affect appropriate. Cranial Nerves: Fundoscopic exam reveals sharp disc margins.  Pupils equal, briskly reactive to light.  Extraocular movements full without nystagmus.  Visual fields full to confrontation.  Hearing intact and symmetric to finger rub.  Facial sensation intact.  Face tongue, palate move normally and symmetrically.  Neck flexion and extension normal. Motor: Normal bulk and tone. Normal strength in all tested extremity muscles. Has bilateral outstretched hand tremor, greater left than right Sensory: Intact to touch and temperature in all extremities.  Coordination: Rapid alternating  movements normal in all extremities.  Finger-to-nose and heel-to shin performed accurately bilaterally.  Romberg negative. Gait and Station: Arises from chair without difficulty.  Stance is normal. Gait demonstrates normal stride length and balance.  Tends to walk quickly and on his toes but can be reminded to put his heels down. Able to heel and toe walk without difficulty, tandem walk is clumsy  Impression: Generalized convulsive epilepsy (HCC) - Plan: VIMPAT 50 MG TABS tablet, VIMPAT 100 MG TABS, divalproex (DEPAKOTE ER) 500 MG 24 hr tablet  Migraine without aura and without status migrainosus, not intractable - Plan: propranolol (INDERAL) 10 MG tablet  Episodic tension-type headache, not intractable  Obstructive hydrocephalus (HCC)  Delay of cognitive development  Episodic mood disorder (HCC)    Recommendations for plan of care: The patient's previous [facility] records were reviewed. Randall Anderson has neither had nor required imaging or lab studies since the last visit. He is a 35 year old man with history of generalized convulsive epilepsy, obstructive hydrocephalus treated with VP shunt, migraine and tension headaches, intellectual delay and episodes of angry mood. He is taking and tolerating Vimpat, Divalproex ER and Levetiracetam for seizures. He had a breakthrough seizure yesterday in the setting of missed medication. I reminded Randall Anderson of the need for him to be compliant with medications and  to get at least 8 hours of sleep each night. I reminded him to continue to drink plenty of water each day and to avoid skipping meals as these things are known to affect the frequency and severity of headaches. I will see Randall Anderson back in follow up in 6 months or sooner if needed. He and his mother agreed with the plans made today.   The medication list was reviewed and reconciled. No changes were made in the prescribed medications today. A complete medication list was provided to the patient.  Allergies as  of 12/30/2020      Reactions   Other Anaphylaxis   Shell Fish      Medication List       Accurate as of December 30, 2020  4:22 PM. If you have any questions, ask your nurse or doctor.        acetaminophen 500 MG tablet Commonly known as: TYLENOL Take 500 mg by mouth every 4 (four) hours as needed.   albuterol 108 (90 Base) MCG/ACT inhaler Commonly known as: VENTOLIN HFA Inhale 2 puffs into the lungs every 4 (four) hours as needed. Take 2 puffs by mouth as needed for wheezing   cetirizine 10 MG tablet Commonly known as: ZYRTEC Take by mouth.   divalproex 500 MG 24 hr tablet Commonly known as: DEPAKOTE ER Take 1 tablet (500 mg total) by mouth 3 (three) times daily.   levETIRAcetam 500 MG tablet Commonly known as: KEPPRA TAKE 3 TABLETS EVERY MORNING AND 3 TABLETS EVERY EVENING   melatonin 3 MG Tabs tablet Take 1 tablet at supper for 1 week, then take 2 tablets at supper   propranolol 10 MG tablet Commonly known as: INDERAL TAKE 3 TABLETS BY MOUTH EVERY DAY AT BEDTIME   Vimpat 50 MG Tabs tablet Generic drug: lacosamide TAKE 1 TABLET BY MOUTH EVERY DAY IN THE EVENING What changed:   when to take this  additional instructions Changed by: Elveria Rising, NP   Vimpat 100 MG Tabs Generic drug: Lacosamide TAKE 1 TABLET BY MOUTH EVERY MORNING AND TAKE 1 TABLET EVERY EVENING What changed: Another medication with the same name was changed. Make sure you understand how and when to take each. Changed by: Elveria Rising, NP       Total time spent with the patient was 20 minutes, of which 50% or more was spent in counseling and coordination of care.  Elveria Rising NP-C [facility] Ph. [phone] Fax [phone]

## 2020-12-30 NOTE — Patient Instructions (Signed)
Thank you for coming in today.   Instructions for you until your next appointment are as follows: 1. Continue your medications as prescribed. Try not to miss any doses 2. Remember to drink plenty of water each day, try not to skip meals and try to get at least 8 hours of sleep as these things are known to reduce how often people experience headaches.  3. Please sign up for MyChart if you have not done so. 4. Please plan to return for follow up in 6 months or sooner if needed.  At [facility], we are committed to providing exceptional care. You will receive a patient satisfaction survey through text or email regarding your visit today. Your opinion is important to me. Comments are appreciated.

## 2021-02-15 ENCOUNTER — Encounter (INDEPENDENT_AMBULATORY_CARE_PROVIDER_SITE_OTHER): Payer: Self-pay

## 2021-06-25 ENCOUNTER — Ambulatory Visit (INDEPENDENT_AMBULATORY_CARE_PROVIDER_SITE_OTHER): Payer: Medicaid Other | Admitting: Family

## 2021-06-25 ENCOUNTER — Other Ambulatory Visit: Payer: Self-pay

## 2021-06-25 VITALS — BP 128/62 | HR 68 | Wt 162.8 lb

## 2021-06-25 DIAGNOSIS — G43009 Migraine without aura, not intractable, without status migrainosus: Secondary | ICD-10-CM

## 2021-06-25 DIAGNOSIS — G40309 Generalized idiopathic epilepsy and epileptic syndromes, not intractable, without status epilepticus: Secondary | ICD-10-CM

## 2021-06-25 DIAGNOSIS — G911 Obstructive hydrocephalus: Secondary | ICD-10-CM

## 2021-06-25 DIAGNOSIS — G44219 Episodic tension-type headache, not intractable: Secondary | ICD-10-CM

## 2021-06-25 DIAGNOSIS — F819 Developmental disorder of scholastic skills, unspecified: Secondary | ICD-10-CM

## 2021-06-25 DIAGNOSIS — F39 Unspecified mood [affective] disorder: Secondary | ICD-10-CM

## 2021-06-29 ENCOUNTER — Encounter (INDEPENDENT_AMBULATORY_CARE_PROVIDER_SITE_OTHER): Payer: Self-pay | Admitting: Family

## 2021-06-29 MED ORDER — LEVETIRACETAM 500 MG PO TABS: ORAL_TABLET | ORAL | 3 refills | Status: DC

## 2021-06-29 MED ORDER — DIVALPROEX SODIUM ER 500 MG PO TB24: 500.0000 mg | ORAL_TABLET | Freq: Three times a day (TID) | ORAL | 3 refills | Status: DC

## 2021-06-29 MED ORDER — PROPRANOLOL HCL 10 MG PO TABS
ORAL_TABLET | ORAL | 3 refills | Status: DC
Start: 2021-06-29 — End: 2022-07-23

## 2021-06-29 NOTE — Patient Instructions (Signed)
Thank you for coming in today.   Instructions for you until your next appointment are as follows: Continuing giving Charleston's medication as prescribed Let me know if you have trouble getting his medication from the pharmacy I will see Davelle back in follow up in 6 months or sooner if needed.  Please sign up for MyChart if you have not done so. Please plan to return for follow up in 6 months or sooner if needed.  At [facility], we are committed to providing exceptional care. You will receive a patient satisfaction survey through text or email regarding your visit today. Your opinion is important to me. Comments are appreciated.

## 2021-06-29 NOTE — Progress Notes (Signed)
Randall Anderson   MRN:  [id number]  04/12/1986   Provider: Elveria Rising NP-C Location of Care: [facility]  Visit type: Return visit  Last visit: 12/30/2020  Referral source: Janece Canterbury, MD History from: mother, patient and Epic chart  Brief history:  Copied from previous record: History of congenital hydrocephalus treated with VP shunt, cognitive delay, migraine and tension headaches, seizures, and episodes of angry mood. Randall Anderson is taking and tolerating Vimpat, Divalproex ER and Levetiracetam for his seizure disorder.   Today's concerns: Mom reports today that Randall Anderson had a few brief seizures when the pharmacy had to order the Vimpat and Randall Anderson missed several days of doses. Randall Anderson has had occasional headaches but they have not been severe. Mom reports that his mood can be variable but for the most part Randall Anderson does fairly well.  Randall Anderson has been otherwise generally healthy since Randall Anderson was last seen. Neither Randall Anderson nor his mother have other health concerns for him today other than previously mentioned.  Review of systems: Please see HPI for neurologic and other pertinent review of systems. Otherwise all other systems were reviewed and were negative.  Problem List: Patient Active Problem List   Diagnosis Date Noted   Migraine without aura and without status migrainosus, not intractable 12/31/2015   Delay of cognitive development 07/01/2015   Transient alteration of awareness 07/27/2014   Recurrent falls 07/07/2014   Episodic tension type headache 04/17/2013   Long-term use of high-risk medication 04/17/2013   Obstructive hydrocephalus (HCC) 04/17/2013   Generalized convulsive epilepsy (HCC) 04/17/2013   Depressive disorder, not elsewhere classified 04/17/2013   Episodic mood disorder (HCC) 04/17/2013   Personal history of methicillin resistant Staphylococcus aureus 04/17/2013     Past Medical History:  Diagnosis Date   Congenital hydrocephalus (HCC)    Hx MRSA infection     Intellectual delay    Seizures (HCC)    VP (ventriculoperitoneal) shunt status     Past medical history comments: See HPI Copied from previous record: In October 2015, Randall Anderson was falling frequently. Randall Anderson had CT scan of the head at that time that was unchanged from prior studies. The falling episodes gradually diminished and resolved without intervention.  Surgical history: Past Surgical History:  Procedure Laterality Date   CIRCUMCISION  1989   VENTRICULOPERITONEAL SHUNT     VP shunt,  endoscopic surgery to open up the floor of third ventricle and have it communicate with the subarachnoid space.      Family history: family history includes Cancer in his father, maternal grandfather, and paternal grandfather; Congestive Heart Failure in his maternal grandmother; Diabetes in an other family member.   Social history: Social History   Socioeconomic History   Marital status: Single    Spouse name: Not on file   Number of children: Not on file   Years of education: Not on file   Highest education level: Not on file  Occupational History   Not on file  Tobacco Use   Smoking status: Passive Smoke Exposure - Never Smoker   Smokeless tobacco: Never   Tobacco comments:    Mom smokes   Substance and Sexual Activity   Alcohol use: No    Alcohol/week: 0.0 standard drinks   Drug use: No   Sexual activity: Never  Other Topics Concern   Not on file  Social History Narrative   Randall Anderson graduated from [employer] in 2008. Randall Anderson enjoys washing dishes, vacuuming, playing on the computer, and going to church.  Randall Anderson lives with his mother. Randall Anderson has 1 older and 1 younger siblings that do not reside in the home.    Social Determinants of Health   Financial Resource Strain: Not on file  Food Insecurity: Not on file  Transportation Needs: Not on file  Physical Activity: Not on file  Stress: Not on file  Social Connections: Not on file  Intimate Partner Violence: Not on file     Past/failed meds:  Allergies: Allergies  Allergen Reactions   Other Anaphylaxis    Shell Fish   Immunizations:  There is no immunization history on file for this patient.   Diagnostics/Screenings: Copied from previous record: 07/05/2014 - CT head wo contrast ([facility]) - significant hydrocephalus - unchanged from prior studies. No acute intracranial abnormalities.   Physical Exam: BP 128/62   Pulse 68   Wt 162 lb 12.8 oz (73.8 kg)   BMI 32.33 kg/m   General: Short statured but well developed, well nourished man, seated on exam table, in no evident distress,  brown hair, brown eyes, right handed Head: Head normocephalic and atraumatic.  Oropharynx benign. Neck: Supple Cardiovascular: Regular rate and rhythm, no murmurs Respiratory: Breath sounds clear to auscultation Musculoskeletal: No obvious deformities or scoliosis Skin: No rashes or neurocutaneous lesions  Neurologic Exam Mental Status: Awake and fully alert.  Oriented to place and time. Attention span, concentration, and fund of knowledge subnormal.  Mood and affect appropriate. Cranial Nerves: Fundoscopic exam reveals sharp disc margins.  Pupils equal, briskly reactive to light.  Extraocular movements full without nystagmus. Hearing intact and symmetric to whisper  Facial sensation intact.  Face tongue, palate move normally and symmetrically. Motor: Normal bulk and tone. Normal strength in all tested extremity muscles. Has bilateral outstretched hand tremor. Sensory: Intact to touch and temperature in all extremities.  Coordination: Rapid alternating movements slightly clumsy in all extremities.  Finger-to-nose and heel-to shin performed accurately bilaterally.  Romberg negative. Gait and Station: Arises from chair without difficulty.  Stance is normal. Gait demonstrates normal stride length and balance. Tends to walk quickly and on his toes but can be reminded to put his heels down.  Impression: Generalized  convulsive epilepsy (HCC) - Plan: levETIRAcetam (KEPPRA) 500 MG tablet, divalproex (DEPAKOTE ER) 500 MG 24 hr tablet  Migraine without aura and without status migrainosus, not intractable - Plan: propranolol (INDERAL) 10 MG tablet  Episodic tension-type headache, not intractable  Delay of cognitive development  Episodic mood disorder (HCC)  Obstructive hydrocephalus (HCC)   Recommendations for plan of care: The patient's previous [facility] records were reviewed. Randall Anderson has neither had nor required imaging or lab studies since the last visit. Randall Anderson is a 35 year old man with history of congenital hydrocephalus treated with VP shunt, cognitive delay, migraine and tension headaches, seizures and episodes of angry mood. Randall Anderson is taking and tolerating Vimpat, Divalproex ER and Levetiracetam for his seizure disorder. Randall Anderson had some seizures in the setting of missing Vimpat doses because of a problem with obtaining the medication. Randall Anderson is otherwise doing well and will continue on the medication without change for now. I will see Randall Anderson back in follow up in 6 months or sooner if needed.   Dr Sharene Skeans was consulted and came in to see the patient as Randall Anderson will be retiring at the end of this month. Mom agreed with the plans made today.  The medication list was reviewed and reconciled. No changes were made in the prescribed medications today. A complete medication list was provided  to the patient.  Return in about 6 months (around 12/23/2021).   Allergies as of 06/25/2021       Reactions   Other Anaphylaxis   Shell Fish        Medication List        Accurate as of June 25, 2021 11:59 PM. If you have any questions, ask your nurse or doctor.          acetaminophen 500 MG tablet Commonly known as: TYLENOL Take 500 mg by mouth every 4 (four) hours as needed.   albuterol 108 (90 Base) MCG/ACT inhaler Commonly known as: VENTOLIN HFA Inhale 2 puffs into the lungs every 4 (four) hours as needed. Take 2  puffs by mouth as needed for wheezing   cetirizine 10 MG tablet Commonly known as: ZYRTEC Take by mouth.   divalproex 500 MG 24 hr tablet Commonly known as: DEPAKOTE ER Take 1 tablet (500 mg total) by mouth 3 (three) times daily.   levETIRAcetam 500 MG tablet Commonly known as: KEPPRA TAKE 3 TABLETS EVERY MORNING AND 3 TABLETS EVERY EVENING   melatonin 3 MG Tabs tablet Take 1 tablet at supper for 1 week, then take 2 tablets at supper   propranolol 10 MG tablet Commonly known as: INDERAL TAKE 3 TABLETS BY MOUTH EVERY DAY AT BEDTIME   Vimpat 50 MG Tabs tablet Generic drug: lacosamide TAKE 1 TABLET BY MOUTH EVERY DAY IN THE EVENING   Vimpat 100 MG Tabs Generic drug: Lacosamide TAKE 1 TABLET BY MOUTH EVERY MORNING AND TAKE 1 TABLET EVERY EVENING       Total time spent with the patient was 20 minutes, of which 50% or more was spent in counseling and coordination of care.  Elveria Rising NP-C [facility] Ph. [phone] Fax [phone]

## 2021-07-23 ENCOUNTER — Other Ambulatory Visit (INDEPENDENT_AMBULATORY_CARE_PROVIDER_SITE_OTHER): Payer: Self-pay | Admitting: Family

## 2021-07-23 DIAGNOSIS — G40309 Generalized idiopathic epilepsy and epileptic syndromes, not intractable, without status epilepticus: Secondary | ICD-10-CM

## 2021-07-24 ENCOUNTER — Other Ambulatory Visit (INDEPENDENT_AMBULATORY_CARE_PROVIDER_SITE_OTHER): Payer: Self-pay | Admitting: Family

## 2021-07-24 DIAGNOSIS — G40309 Generalized idiopathic epilepsy and epileptic syndromes, not intractable, without status epilepticus: Secondary | ICD-10-CM

## 2021-11-07 ENCOUNTER — Other Ambulatory Visit (INDEPENDENT_AMBULATORY_CARE_PROVIDER_SITE_OTHER): Payer: Self-pay | Admitting: Family

## 2021-11-07 DIAGNOSIS — G40309 Generalized idiopathic epilepsy and epileptic syndromes, not intractable, without status epilepticus: Secondary | ICD-10-CM

## 2021-12-11 ENCOUNTER — Ambulatory Visit (INDEPENDENT_AMBULATORY_CARE_PROVIDER_SITE_OTHER): Payer: Medicaid Other | Admitting: Family

## 2021-12-31 ENCOUNTER — Other Ambulatory Visit: Payer: Self-pay

## 2021-12-31 ENCOUNTER — Encounter (INDEPENDENT_AMBULATORY_CARE_PROVIDER_SITE_OTHER): Payer: Self-pay | Admitting: Family

## 2021-12-31 ENCOUNTER — Ambulatory Visit (INDEPENDENT_AMBULATORY_CARE_PROVIDER_SITE_OTHER): Payer: Medicaid Other | Admitting: Family

## 2021-12-31 VITALS — BP 100/80 | HR 80 | Ht 59.06 in | Wt 156.8 lb

## 2021-12-31 DIAGNOSIS — G44219 Episodic tension-type headache, not intractable: Secondary | ICD-10-CM

## 2021-12-31 DIAGNOSIS — G911 Obstructive hydrocephalus: Secondary | ICD-10-CM

## 2021-12-31 DIAGNOSIS — G43009 Migraine without aura, not intractable, without status migrainosus: Secondary | ICD-10-CM

## 2021-12-31 DIAGNOSIS — F39 Unspecified mood [affective] disorder: Secondary | ICD-10-CM

## 2021-12-31 DIAGNOSIS — F819 Developmental disorder of scholastic skills, unspecified: Secondary | ICD-10-CM

## 2021-12-31 DIAGNOSIS — G40309 Generalized idiopathic epilepsy and epileptic syndromes, not intractable, without status epilepticus: Secondary | ICD-10-CM

## 2021-12-31 MED ORDER — VIMPAT 150 MG PO TABS: ORAL_TABLET | ORAL | 5 refills | Status: DC

## 2021-12-31 MED ORDER — VIMPAT 100 MG PO TABS: ORAL_TABLET | ORAL | 5 refills | Status: DC

## 2021-12-31 NOTE — Patient Instructions (Signed)
It was a pleasure to see you today! ? ?Instructions for you until your next appointment are as follows: ?I will order Vimpat 150mg  tablets - take 1 at bedtime ?Continue taking Vimpat 100mg  tablets - 1 every morning ?Stop taking Vimpat 50mg  tablets ?Continue your other medications as prescribed ?Try not to miss any doses of medications.  ?Work on being awake and active during the day. This will help with headaches and seizures.  ?Please sign up for MyChart if you have not done so. ?Please plan to return for follow up in 6 months or sooner if needed. ? ?  ?Feel free to contact our office during normal business hours at [phone] with questions or concerns. If there is no answer or the call is outside business hours, please leave a message and our clinic staff will call you back within the next business day.  If you have an urgent concern, please stay on the line for our after-hours answering service and ask for the on-call neurologist.   ?  ?I also encourage you to use MyChart to communicate with me more directly. If you have not yet signed up for MyChart within [facility], the front desk staff can help you. However, please note that this inbox is NOT monitored on nights or weekends, and response can take up to 2 business days.  Urgent matters should be discussed with the on-call pediatric neurologist.  ? ?At [facility], we are committed to providing exceptional care. You will receive a patient satisfaction survey through text or email regarding your visit today. Your opinion is important to me. Comments are appreciated.   ?

## 2021-12-31 NOTE — Progress Notes (Signed)
? ?Randall Anderson   ?MRN:  [id number]  ?1986/08/04  ? ?Provider: Rockwell Germany NP-C ?Location of Care: [facility] ? ?Visit type: return visit ? ?Last visit: 06/25/2021 ? ?Referral source: Smith Mince, MD ?History from: Epic chart, patient and his mother ? ?Brief history:  ?Copied from previous record: ?History of congenital hydrocephalus treated with VP shunt, cognitive delay, migraine and tension headaches, seizures, and episodes of angry mood. He is taking and tolerating Vimpat, Divalproex ER and Levetiracetam for his seizure disorder.  ? ?Today's concerns: ?Mom reports today that Randall Anderson had 1 seizure since his last visit. It occurred around 2AM. He was not injured during the seizure. He is compliant with medication but doesn't like taking so many pills. Mom feels that the seizure occurred because of his sleep schedule. He tends to sleep most of the day while Mom is at work, and also sleeps at night.  ? ?Randall Anderson has occasional headaches that usually correlate with changes in his mood. He can be angry at times but is not aggressive.  ? ?Randall Anderson has been otherwise generally healthy since he was last seen. Neither he nor his mother have other health concerns for him today other than previously mentioned. ? ?Review of systems: ?Please see HPI for neurologic and other pertinent review of systems. Otherwise all other systems were reviewed and were negative. ? ?Problem List: ?Patient Active Problem List  ? Diagnosis Date Noted  ? Migraine without aura and without status migrainosus, not intractable 12/31/2015  ? Delay of cognitive development 07/01/2015  ? Transient alteration of awareness 07/27/2014  ? Recurrent falls 07/07/2014  ? Episodic tension type headache 04/17/2013  ? Long-term use of high-risk medication 04/17/2013  ? Obstructive hydrocephalus ([place]) 04/17/2013  ? Generalized convulsive epilepsy ([place]) 04/17/2013  ? Depressive disorder, not elsewhere classified 04/17/2013  ? Episodic mood  disorder ([place]) 04/17/2013  ? Personal history of methicillin resistant Staphylococcus aureus 04/17/2013  ?  ? ?Past Medical History:  ?Diagnosis Date  ? Congenital hydrocephalus ([place])   ? Hx MRSA infection   ? Intellectual delay   ? Seizures ([place])   ? VP (ventriculoperitoneal) shunt status   ?  ?Past medical history comments: See HPI ?Copied from previous record: ?In October 2015, [name] was falling frequently. He had CT scan of the head at that time that was unchanged from prior studies. The falling episodes gradually diminished and resolved without intervention. ? ?Surgical history: ?Past Surgical History:  ?Procedure Laterality Date  ? CIRCUMCISION  1989  ? VENTRICULOPERITONEAL SHUNT    ? VP shunt,  endoscopic surgery to open up the floor of third ventricle and have it communicate with the subarachnoid space.   ?  ?Family history: ?family history includes Cancer in his father, maternal grandfather, and paternal grandfather; Congestive Heart Failure in his maternal grandmother; Diabetes in an other family member.  ? ?Social history: ?Social History  ? ?Socioeconomic History  ? Marital status: Single  ?  Spouse name: Not on file  ? Number of children: Not on file  ? Years of education: Not on file  ? Highest education level: Not on file  ?Occupational History  ? Not on file  ?Tobacco Use  ? Smoking status: Never  ?  Passive exposure: Yes  ? Smokeless tobacco: Never  ? Tobacco comments:  ?  Mom smokes   ?Substance and Sexual Activity  ? Alcohol use: No  ?  Alcohol/week: 0.0 standard drinks  ? Drug use: No  ? Sexual activity:  Never  ?Other Topics Concern  ? Not on file  ?Social History Narrative  ? [name] graduated from [employer] in 2008. [name] enjoys washing dishes, vacuuming, playing on the computer, and going to church.  ? [name] lives with his mother. He has 1 older and 1 younger siblings that do not reside in the home.   ? ?Social Determinants of Health  ? ?Financial Resource Strain: Not on file   ?Food Insecurity: Not on file  ?Transportation Needs: Not on file  ?Physical Activity: Not on file  ?Stress: Not on file  ?Social Connections: Not on file  ?Intimate Partner Violence: Not on file  ?  ?Past/failed meds: ? ?Allergies: ?Allergies  ?Allergen Reactions  ? Other Anaphylaxis  ?  Shell Fish  ?  ?Immunizations: ? ?There is no immunization history on file for this patient.  ? ?Diagnostics/Screenings: ?Copied from previous record: ?07/05/2014 - CT head wo contrast ([facility]) - significant hydrocephalus - unchanged from prior studies. No acute intracranial abnormalities.  ? ?Physical Exam: ?BP 100/80   Pulse 80   Ht 4' 11.06" (1.5 m)   Wt 156 lb 12.8 oz (71.1 kg)   BMI 31.61 kg/m?   ?General: Short statured but otherwise well developed, well nourished man, seated on exam table, in no evident distress ?Head: Head normocephalic and atraumatic.  Oropharynx benign. ?Neck: Supple ?Cardiovascular: Regular rate and rhythm, no murmurs ?Respiratory: Breath sounds clear to auscultation ?Musculoskeletal: No obvious deformities or scoliosis ?Skin: No rashes or neurocutaneous lesions ? ?Neurologic Exam ?Mental Status: Awake and fully alert.  Oriented to place and time.  Recent and remote memory intact.  Attention span, concentration, and fund of knowledge subnormal for age.  Mood and affect appropriate. ?Cranial Nerves: Fundoscopic exam reveals sharp disc margins.  Pupils equal, briskly reactive to light.  Extraocular movements full without nystagmus. Hearing intact and symmetric to whisper.  Facial sensation intact.  Face tongue, palate move normally and symmetrically. ?Motor: Normal bulk and tone. Normal strength in all tested extremity muscles. ?Sensory: Intact to touch and temperature in all extremities.  ?Coordination: Finger-to-nose performed accurately bilaterally. ?Gait and Station: Arises from chair without difficulty.  Stance is normal. Gait demonstrates normal stride length and balance. Tends to walk  quickly and on his toes but can reminded to put his heels down. ?Reflexes: 1+ and symmetric. Toes downgoing.  ? ?Impression: ?Generalized convulsive epilepsy ([place]) - Plan: VIMPAT 150 MG TABS, VIMPAT 100 MG TABS ? ?Migraine without aura and without status migrainosus, not intractable ? ?Delay of cognitive development ? ?Episodic tension-type headache, not intractable ? ?Obstructive hydrocephalus ([place]) ? ?Episodic mood disorder ([place])  ? ?Recommendations for plan of care: ?The patient's previous [facility] records were reviewed. Commodore has neither had nor required imaging or lab studies since the last visit. He is a 36 year old man with history of congenital hydrocephalus with VP shunt, cognitive delay, migraine and tension headaches, episodes of angry mood and epilepsy. He is taking and tolerating Vimpat, Divalproex ER and Levetiracetam, and has occasional breakthrough seizures. He sleeps excessively at time and Mom believes he has more seizures when he does so. [name] doesn't like his medication regimen and I recommended a change in Vimpat strength to simplify his regimen somewhat. I talked with [name] about working on his sleep schedule and staying awake during the day. I asked Mom to let me know if more seizures occur. I will otherwise see him back in follow up in 6 months or sooner if needed. Mom  agreed with the plans made today.  ? ?The medication list was reviewed and reconciled. I reviewed the changes that were made in the prescribed medications today. A complete medication list was provided to the patient. ? ?Return in about 6 months (around 07/03/2022). ? ? ?Allergies as of 12/31/2021   ? ?   Reactions  ? Other Anaphylaxis  ? Shell Fish  ? ?  ? ?  ?Medication List  ?  ? ?  ? Accurate as of December 31, 2021 11:59 PM. If you have any questions, ask your nurse or doctor.  ?  ?  ? ?  ? ?STOP taking these medications   ? ?Vimpat 50 MG Tabs tablet ?Generic drug: lacosamide ?Replaced by: Vimpat 150 MG Tabs ?You also have  another medication with the same name that you need to continue taking as instructed. ?Stopped by: Rockwell Germany, NP ?  ? ?  ? ?TAKE these medications   ? ?acetaminophen 500 MG tablet ?Commonly known as: T

## 2022-01-01 ENCOUNTER — Encounter (INDEPENDENT_AMBULATORY_CARE_PROVIDER_SITE_OTHER): Payer: Self-pay | Admitting: Family

## 2022-05-31 ENCOUNTER — Other Ambulatory Visit (INDEPENDENT_AMBULATORY_CARE_PROVIDER_SITE_OTHER): Payer: Self-pay | Admitting: Family

## 2022-05-31 DIAGNOSIS — G40309 Generalized idiopathic epilepsy and epileptic syndromes, not intractable, without status epilepticus: Secondary | ICD-10-CM

## 2022-07-06 ENCOUNTER — Ambulatory Visit (INDEPENDENT_AMBULATORY_CARE_PROVIDER_SITE_OTHER): Payer: Medicaid Other | Admitting: Family

## 2022-07-11 NOTE — Progress Notes (Unsigned)
Randall Anderson   MRN:  [id number]  1986/08/05   Provider: Rockwell Germany NP-C Location of Care: [facility]  Visit type: Return visit  Last visit: 12/31/2021  Referral source: Randall Snook, NP  History from: Epic chart, patient and his mother  Brief history:  Copied from previous record: History of congenital hydrocephalus treated with VP shunt, cognitive delay, migraine and tension headaches, seizures, and episodes of angry mood. He is taking and tolerating Vimpat, Divalproex ER and Levetiracetam for his seizure disorder  Today's concerns: Mom reports today that Randall Anderson has had a few very brief seizures since his last visit that did not require intervention. He is compliant with medication and generally gets enough sleep.  He has not had problems with headache. He has intermittent problems with angry mood but his mood has been better recently.   Randall Anderson has been otherwise generally healthy since he was last seen. Neither he nor his mother have other health concerns for him today other than previously mentioned.  Review of systems: Please see HPI for neurologic and other pertinent review of systems. Otherwise all other systems were reviewed and were negative.  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 He had CT scan of the head at that time that was unchanged from prior studies. The falling episodes gradually diminished and resolved without intervention.  Surgical history: Past Surgical History:  Procedure Laterality Date   CIRCUMCISION  1989   VENTRICULOPERITONEAL SHUNT     VP shunt,  endoscopic surgery to open up the floor of third ventricle and have it communicate with the subarachnoid space.      Family history: family history includes Cancer in his father, maternal grandfather, and paternal grandfather; Congestive Heart Failure in his maternal grandmother; Diabetes in an other family member.   Social history: Social History   Socioeconomic History   Marital status: Single    Spouse name: Not on file   Number of children: Not on file   Years of education: Not on file   Highest education level: Not on file  Occupational History   Not on file  Tobacco Use   Smoking status: Never    Passive exposure: Yes   Smokeless tobacco: Never   Tobacco comments:    Mom smokes   Substance and Sexual Activity   Alcohol use: No    Alcohol/week: 0.0 standard drinks of alcohol   Drug use: No   Sexual activity: Never  Other Topics Concern   Not on file  Social History Narrative   Randall Anderson graduated from [employer] in 2008. Randall Anderson enjoys washing dishes, vacuuming, playing on the  computer, and going to church.   Randall Anderson lives with his mother. He has 1 older and 1 younger siblings that do not reside in the home.    Social Determinants of Health   Financial Resource Strain: Not on file  Food Insecurity: Not on file  Transportation Needs: Not on file  Physical Activity: Not on file  Stress: Not on file  Social Connections: Not on  file  Intimate Partner Violence: Not on file   Past/failed meds:  Allergies: Allergies  Allergen Reactions   Other Anaphylaxis    Shell Fish    Immunizations:  There is no immunization history on file for this patient.    Diagnostics/Screenings: Copied from previous record: 07/05/2014 - CT head wo contrast ([facility]) - significant hydrocephalus - unchanged from prior studies. No acute intracranial abnormalities.     Physical Exam: BP 110/76   Pulse 70   General: short statured but otherwise well developed, well nourished man, seated on exam table, in no evident distress Head: normocephalic and atraumatic. Oropharynx benign. No dysmorphic features. Neck: supple Cardiovascular: regular rate and rhythm, no murmurs. Respiratory: clear to auscultation bilaterally Abdomen: bowel sounds present all four quadrants, abdomen soft, non-tender, non-distended. Musculoskeletal: no skeletal deformities or obvious scoliosis. Skin: no rashes or neurocutaneous lesions  Neurologic Exam Mental Status: awake and fully alert. Oriented to time and place. Fund of knowledge subnormal for age. Speech is clear. Mood and affect appropriate today.  Cranial Nerves: fundoscopic exam - red reflex present.  Unable to fully visualize fundus.  Pupils equal briskly reactive to light.  Turns to localize faces and objects in the periphery. Turns to localize sounds in the periphery. Facial movements are symmetric. Motor: normal bulk, tone and strength Sensory: withdrawal x 4 Coordination: No dysmetria when reaching for objects. Balance is fair. Gait and Station: stance is normal. Tends to walk quickly and on his toes but can be reminded to get his heels down.  Reflexes: diminished and symmetric. Toes neutral. No clonus   Impression: Generalized convulsive epilepsy ([place])  Migraine without aura and without status migrainosus, not intractable  Delay of cognitive development  Episodic tension-type  headache, not intractable  Obstructive hydrocephalus ([facility])  Episodic mood disorder ([place])    Recommendations for plan of care: The patient's previous Epic records were reviewed. Randall Anderson has neither had nor required imaging or lab studies since the last visit.   The medication list was reviewed and reconciled. No changes were made in the prescribed medications today. A complete medication list was provided to the patient.  No orders of the defined types were placed in this encounter.   No follow-ups on file.   Allergies as of 07/12/2022       Reactions   Other Anaphylaxis   Shell Fish        Medication List        Accurate as of July 12, 2022  6:15 PM. If you have any questions, ask your nurse or doctor.          STOP taking these medications    albuterol 108 (90 Base) MCG/ACT inhaler Commonly known as: VENTOLIN HFA Stopped by: Randall Germany, NP   cetirizine 10 MG tablet Commonly known as: ZYRTEC Stopped by: Randall Germany, NP   melatonin 3 MG Tabs tablet Stopped by: Randall Germany, NP       TAKE these medications    acetaminophen 500 MG tablet Commonly known as: TYLENOL Take 500 mg by mouth every 4 (four) hours as  needed.   divalproex 500 MG 24 hr tablet Commonly known as: DEPAKOTE ER Take 1 tablet (500 mg total) by mouth 3 (three) times daily.   levETIRAcetam 500 MG tablet Commonly known as: KEPPRA TAKE 3 TABLETS EVERY MORNING AND 3 TABLETS EVERY EVENING   propranolol 10 MG tablet Commonly known as: INDERAL TAKE 3 TABLETS BY MOUTH EVERY DAY AT BEDTIME   Vimpat 150 MG Tabs Generic drug: Lacosamide TAKE 1 TABLET BY MOUTH AT BEDTIME What changed: Another medication with the same name was changed. Make sure you understand how and when to take each. Changed by: Randall Germany, NP   Vimpat 100 MG Tabs Generic drug: Lacosamide TAKE 1 TABLET EVERY MORNING What changed: additional instructions Changed by: Randall Germany, NP       Total time spent with the patient was 20 minutes, of which 50% or more was spent in counseling and coordination of care.  Randall Germany NP-C [facility] Ph. [phone] Fax [phone]

## 2022-07-12 ENCOUNTER — Ambulatory Visit (INDEPENDENT_AMBULATORY_CARE_PROVIDER_SITE_OTHER): Payer: Medicaid Other | Admitting: Family

## 2022-07-12 ENCOUNTER — Encounter (INDEPENDENT_AMBULATORY_CARE_PROVIDER_SITE_OTHER): Payer: Self-pay | Admitting: Family

## 2022-07-12 VITALS — BP 110/76 | HR 70

## 2022-07-12 DIAGNOSIS — F39 Unspecified mood [affective] disorder: Secondary | ICD-10-CM

## 2022-07-12 DIAGNOSIS — G44219 Episodic tension-type headache, not intractable: Secondary | ICD-10-CM

## 2022-07-12 DIAGNOSIS — G40309 Generalized idiopathic epilepsy and epileptic syndromes, not intractable, without status epilepticus: Secondary | ICD-10-CM | POA: Diagnosis not present

## 2022-07-12 DIAGNOSIS — F819 Developmental disorder of scholastic skills, unspecified: Secondary | ICD-10-CM | POA: Diagnosis not present

## 2022-07-12 DIAGNOSIS — G43009 Migraine without aura, not intractable, without status migrainosus: Secondary | ICD-10-CM

## 2022-07-12 DIAGNOSIS — G911 Obstructive hydrocephalus: Secondary | ICD-10-CM

## 2022-07-12 MED ORDER — VIMPAT 100 MG PO TABS: ORAL_TABLET | ORAL | 5 refills | Status: DC

## 2022-07-12 NOTE — Patient Instructions (Signed)
It was a pleasure to see you today!  Instructions for you until your next appointment are as follows: Continue taking your medications as prescribed Let me know if the seizures become more frequent or more severe Please sign up for MyChart if you have not done so. Please plan to return for follow up in 6 months or sooner if needed.  Feel free to contact our office during normal business hours at [phone] with questions or concerns. If there is no answer or the call is outside business hours, please leave a message and our clinic staff will call you back within the next business day.  If you have an urgent concern, please stay on the line for our after-hours answering service and ask for the on-call neurologist.     I also encourage you to use MyChart to communicate with me more directly. If you have not yet signed up for MyChart within [facility], the front desk staff can help you. However, please note that this inbox is NOT monitored on nights or weekends, and response can take up to 2 business days.  Urgent matters should be discussed with the on-call pediatric neurologist.   At [facility], we are committed to providing exceptional care. You will receive a patient satisfaction survey through text or email regarding your visit today. Your opinion is important to me. Comments are appreciated.

## 2022-07-22 ENCOUNTER — Other Ambulatory Visit (INDEPENDENT_AMBULATORY_CARE_PROVIDER_SITE_OTHER): Payer: Self-pay | Admitting: Family

## 2022-07-22 DIAGNOSIS — G43009 Migraine without aura, not intractable, without status migrainosus: Secondary | ICD-10-CM

## 2022-07-23 NOTE — Telephone Encounter (Signed)
Seen 07/12/22 follow up 01/12/2023

## 2022-08-19 ENCOUNTER — Other Ambulatory Visit (INDEPENDENT_AMBULATORY_CARE_PROVIDER_SITE_OTHER): Payer: Self-pay | Admitting: Family

## 2022-08-19 DIAGNOSIS — G40309 Generalized idiopathic epilepsy and epileptic syndromes, not intractable, without status epilepticus: Secondary | ICD-10-CM

## 2022-12-09 ENCOUNTER — Other Ambulatory Visit (INDEPENDENT_AMBULATORY_CARE_PROVIDER_SITE_OTHER): Payer: Self-pay | Admitting: Family

## 2022-12-09 DIAGNOSIS — G40309 Generalized idiopathic epilepsy and epileptic syndromes, not intractable, without status epilepticus: Secondary | ICD-10-CM

## 2022-12-09 NOTE — Telephone Encounter (Signed)
Seen 07/13/2023 Next OV 01/12/2023 Last written 07/12/22 for 100 mg with 5 rf                     05/2022 for 150 mg with 5 rf

## 2022-12-13 ENCOUNTER — Telehealth (INDEPENDENT_AMBULATORY_CARE_PROVIDER_SITE_OTHER): Payer: Self-pay | Admitting: Family

## 2022-12-13 NOTE — Telephone Encounter (Signed)
Contacted patients pharmacy, spoke to a representative by the name of Maudie Mercury.   Maudie Mercury stated that she did not receive RX's that were sent in on 2.29.2024 for patients VIMPAT. She asked how many refills and the directions. I inform her of the refills and directions.   Maudie Mercury stated that she would update the RX they have on file.  SS, CCMA

## 2022-12-13 NOTE — Telephone Encounter (Signed)
Who's calling (name and relationship to patient) : Susa Day; mom   Best contact number: [phone]  Provider they see: Agustin Cree, NP   Reason for call: Mom states that he needs both of his Vimpats filled,  the pharmacy won't refill. He has been out for about a week   Call ID:      [place]  Name of prescription:  Pharmacy: [facility] in  [place]

## 2023-01-12 ENCOUNTER — Ambulatory Visit (INDEPENDENT_AMBULATORY_CARE_PROVIDER_SITE_OTHER): Payer: Medicaid Other | Admitting: Family

## 2023-01-12 ENCOUNTER — Encounter (INDEPENDENT_AMBULATORY_CARE_PROVIDER_SITE_OTHER): Payer: Self-pay | Admitting: Family

## 2023-01-12 VITALS — BP 108/84 | HR 72 | Ht 59.25 in | Wt 162.8 lb

## 2023-01-12 DIAGNOSIS — G911 Obstructive hydrocephalus: Secondary | ICD-10-CM

## 2023-01-12 DIAGNOSIS — G43009 Migraine without aura, not intractable, without status migrainosus: Secondary | ICD-10-CM | POA: Diagnosis not present

## 2023-01-12 DIAGNOSIS — F819 Developmental disorder of scholastic skills, unspecified: Secondary | ICD-10-CM

## 2023-01-12 DIAGNOSIS — G40309 Generalized idiopathic epilepsy and epileptic syndromes, not intractable, without status epilepticus: Secondary | ICD-10-CM | POA: Diagnosis not present

## 2023-01-12 DIAGNOSIS — F39 Unspecified mood [affective] disorder: Secondary | ICD-10-CM

## 2023-01-12 DIAGNOSIS — G44219 Episodic tension-type headache, not intractable: Secondary | ICD-10-CM

## 2023-01-12 MED ORDER — PROPRANOLOL HCL 10 MG PO TABS: ORAL_TABLET | ORAL | 1 refills | Status: DC

## 2023-01-12 NOTE — Progress Notes (Signed)
Randall Anderson   MRN:  [id number]  April 15, 1986   Provider: Rockwell Germany NP-C Location of Care: [facility]  Visit type: Return visit  Last visit: 07/12/2022  Referral source: Daleen Snook, NP History from: Epic chart and patient's mother  Brief history:  Copied from previous record: History of congenital hydrocephalus treated with VP shunt, cognitive delay, migraine and tension headaches, seizures, and episodes of angry mood. He is taking and tolerating Vimpat, Divalproex ER and Levetiracetam for his seizure disorder   Today's concerns: Has had 1 seizure since his last visit. Was brief, lasting about 30 seconds. Was not injured during the event Mom has trouble getting the Vimpat refills. She says that the pharmacy won't order the medication until the day it is due since it is a controlled drug, which makes his refill late. He has missed as much as a week of medication for this reason. He has occasional tension and migraine headaches.  Therron has been otherwise generally healthy since he was last seen. No health concerns today other than previously mentioned.  Review of systems: Please see HPI for neurologic and other pertinent review of systems. Otherwise all other systems were reviewed and were negative.  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 He had CT scan of the head at that time that was unchanged from prior studies. The falling episodes gradually diminished and resolved without intervention.   Surgical history: Past Surgical History:  Procedure Laterality Date   CIRCUMCISION  1989   VENTRICULOPERITONEAL SHUNT     VP shunt,  endoscopic surgery to open up the floor of third ventricle and have it communicate with the subarachnoid space.     Family history: family history includes Cancer in his father, maternal grandfather, and paternal grandfather; Congestive Heart Failure in his maternal grandmother; Diabetes in an other family member.   Social history: Social History   Socioeconomic History   Marital status: Single    Spouse name: Not on file   Number of children: Not on file   Years of education: Not on file   Highest education level: Not on file  Occupational History   Not on file  Tobacco Use   Smoking status: Never    Passive exposure: Yes   Smokeless tobacco: Never   Tobacco comments:    Mom smokes   Substance and Sexual Activity   Alcohol use: No    Alcohol/week: 0.0 standard drinks of alcohol   Drug use: No   Sexual activity: Never  Other Topics Concern   Not on file  Social History Narrative   Justinryan graduated from [employer] in 2008. Oreoluwa enjoys washing dishes, vacuuming, playing on the computer, and going to church.   Naif lives with his mother. He has 1 older and 1 younger siblings that do not reside in the home.    Social Determinants of Health   Financial Resource Strain: Not on file  Food Insecurity: Not on file  Transportation Needs: Not on file  Physical Activity:  Not on file  Stress: Not on file  Social Connections: Not on file  Intimate Partner Violence: Not on file    Past/failed meds:  Allergies: Allergies  Allergen Reactions   Other Anaphylaxis    Shell Fish    Immunizations:  There is no immunization history on file for this patient.   Diagnostics/Screenings: Copied from previous record: 07/05/2014 - CT head wo contrast ([facility]) - significant hydrocephalus - unchanged from prior studies. No acute intracranial abnormalities.    Physical Exam: BP 108/84 (BP Location: Left Arm, Patient Position: Sitting, Cuff Size: Small)   Pulse 72   Ht 4' 11.25" (1.505 m)   Wt 162 lb 12.8 oz (73.8 kg)   BMI 32.60 kg/m   General: short statured but well developed, well nourished man, seated in exam room, in no evident distress Head: Head normocephalic and atraumatic.  Oropharynx benign. Neck: Supple Cardiovascular: Regular rate and rhythm, no murmurs Respiratory: Breath sounds clear to auscultation Musculoskeletal: No obvious deformities or scoliosis Skin: No rashes or neurocutaneous lesions  Neurologic Exam Mental Status: Awake and fully alert.  Oriented to place and time.  Recent and remote memory intact.  Fund of knowledge subnormal for age.  Angry mood. Cranial Nerves: Fundoscopic exam reveals sharp disc margins.  Pupils equal, briskly reactive to light.  Turns to locate faces, objects and sounds in the periphery. Facial movements are symmetric Motor: Normal bulk and tone. Normal strength in all tested extremity muscles. Sensory: Intact to touch and temperature in all extremities.  Coordination: No dysmetria when reaching for objects. Balance is fair. Gait and Station: Arises from chair without difficulty.  Stance is normal. Gait demonstrates normal stride length.   Able to heel, toe and tandem walk with mild ataxia. Tends ot walk quickly and on his toes but can be reminded to get his heels down. Reflexes: diminished and symmetric.  Toes downgoing.   Impression: Generalized convulsive epilepsy  Migraine without aura and without status migrainosus, not intractable - Plan: propranolol (INDERAL) 10 MG tablet  Obstructive hydrocephalus  Delay of cognitive development  Episodic tension-type headache, not intractable  Episodic mood disorder ([place])   Recommendations for plan of care: The patient's previous Epic records were reviewed. No recent diagnostic studies to be reviewed with the patient.  Plan until next visit: Continue medications as prescribed  I will call the pharmacy to see if there are other options for the Vimpat refills Call for questions or concerns Return in about 6 months (around 07/14/2023).  The medication list was reviewed and reconciled. No changes were made in the prescribed medications today. A complete medication list was provided to the patient.  Allergies as of 01/12/2023       Reactions   Other Anaphylaxis   Shell Fish        Medication List        Accurate as of January 12, 2023  7:46 PM. If you have any questions, ask your nurse or doctor.          STOP taking these medications    Ventolin HFA 108 (90 Base)  MCG/ACT inhaler Generic drug: albuterol Stopped by: Rockwell Germany, NP       TAKE these medications    acetaminophen 500 MG tablet Commonly known as: TYLENOL Take 500 mg by mouth every 4 (four) hours as needed.   divalproex 500 MG 24 hr tablet Commonly known as: DEPAKOTE ER TAKE 1 TABLET BY MOUTH THREE TIMES A DAY   levETIRAcetam 500 MG tablet Commonly known as: KEPPRA TAKE 3 TABLETS EVERY MORNING AND 3 TABLETS EVERY EVENING   propranolol 10 MG tablet Commonly known as: INDERAL TAKE 3 TABLETS BY MOUTH AT BEDTIME   Vimpat 150 MG Tabs Generic drug: Lacosamide TAKE 1 TABLET AT BEDTIME   Vimpat 100 MG Tabs Generic drug: Lacosamide TAKE 1 TAB AT BEDTIME      Total time spent with the patient was 20 minutes, of which 50% or more was spent in  counseling and coordination of care.  Rockwell Germany NP-C [facility] [id number] [place], [place] [address] Ph. [phone] Fax [phone]

## 2023-01-12 NOTE — Patient Instructions (Signed)
It was a pleasure to see you today!  Instructions for you until your next appointment are as follows: Continue your medications as prescribed I will call the pharmacy to see if there are any options for solving the problem with the Vimpat refills Call me if the seizures become more frequent or severe, or if you have other concerns. Please sign up for MyChart if you have not done so. Please plan to return for follow up in 6 months or sooner if needed.   Feel free to contact our office during normal business hours at [phone] with questions or concerns. If there is no answer or the call is outside business hours, please leave a message and our clinic staff will call you back within the next business day.  If you have an urgent concern, please stay on the line for our after-hours answering service and ask for the on-call neurologist.     I also encourage you to use MyChart to communicate with me more directly. If you have not yet signed up for MyChart within [facility], the front desk staff can help you. However, please note that this inbox is NOT monitored on nights or weekends, and response can take up to 2 business days.  Urgent matters should be discussed with the on-call pediatric neurologist.   At [facility], we are committed to providing exceptional care. You will receive a patient satisfaction survey through text or email regarding your visit today. Your opinion is important to me. Comments are appreciated.

## 2023-07-15 ENCOUNTER — Other Ambulatory Visit (INDEPENDENT_AMBULATORY_CARE_PROVIDER_SITE_OTHER): Payer: Self-pay | Admitting: Family

## 2023-07-15 DIAGNOSIS — G43009 Migraine without aura, not intractable, without status migrainosus: Secondary | ICD-10-CM

## 2023-07-15 DIAGNOSIS — G40309 Generalized idiopathic epilepsy and epileptic syndromes, not intractable, without status epilepticus: Secondary | ICD-10-CM

## 2023-07-15 MED ORDER — VIMPAT 150 MG PO TABS: 1.0000 | ORAL_TABLET | Freq: Every evening | ORAL | 0 refills | Status: DC

## 2023-07-15 MED ORDER — VIMPAT 100 MG PO TABS: 1.0000 | ORAL_TABLET | Freq: Every evening | ORAL | 0 refills | Status: DC

## 2023-07-15 NOTE — Telephone Encounter (Signed)
  Name of who is calling: Mliss Sax  Caller's Relationship to Patient: Mom  Best contact number: [phone]  Provider they see: Elveria Rising  Reason for call: Mom said pt is out of medication- Vimpat 100mg  and 150mg , she said some of his [facility] and she would [facility] [place]- [address]     PRESCRIPTION REFILL ONLY  Name of prescription: Vimpat 100 & 150mg   [facility] [place] [address]

## 2023-07-15 NOTE — Telephone Encounter (Signed)
Last OV 01/2023 Next OV 08/08/2023 Reports he is out of med. Both strengths of Vimpat Last Rx for the 150 mg 12/09/2022 5 rf                           100 mg 2/29/2025 5 rf Mom [facility]  [address]   That is the pharm in Epic but both of these rx's appear to have printed possibly due to being name brand.

## 2023-08-07 NOTE — Patient Instructions (Incomplete)
It was a pleasure to see you today!  Instructions for you until your next appointment are as follows: I have made changes in the medications for seizures as follows: For the Levetiracetam 500mg  - use up what you have by taking 4 tablets in the morning and 4 tablets at night.  The new prescription for Levetiracetam will be 1000mg  tablets and you will take 2 tablets in the morning and 2 tablets at night.  For the Vimpat (Lacosamide) - when you get the new refill of the 150mg  tablets, start taking 2 at bedtime. Stop taking the Vimpat (Lacosamide) 100mg  tablets when you finish with the current bottle Continue the Divalproex XR 500mg  - 1 tablet 3 times per day for now.  Let me know if more seizures occur Please sign up for MyChart if you have not done so. Please plan to return for follow up in 6 months or sooner if needed.  Feel free to contact our office during normal business hours at [phone] with questions or concerns. If there is no answer or the call is outside business hours, please leave a message and our clinic staff will call you back within the next business day.  If you have an urgent concern, please stay on the line for our after-hours answering service and ask for the on-call neurologist.     I also encourage you to use MyChart to communicate with me more directly. If you have not yet signed up for MyChart within [facility], the front desk staff can help you. However, please note that this inbox is NOT monitored on nights or weekends, and response can take up to 2 business days.  Urgent matters should be discussed with the on-call pediatric neurologist.   At [facility], we are committed to providing exceptional care. You will receive a patient satisfaction survey through text or email regarding your visit today. Your opinion is important to me. Comments are appreciated.

## 2023-08-07 NOTE — Progress Notes (Unsigned)
Randall Anderson   MRN:  [id number]  1986-06-20   Provider: Elveria Rising NP-C Location of Care: [facility]  Visit type: Return visit  Last visit: 01/12/2023  Referral source: Josephina Gip, NP History from: Epic chart and patient's mother  Brief history:  Copied from previous record: History of congenital hydrocephalus treated with VP shunt, cognitive delay, migraine and tension headaches, seizures, and episodes of angry mood. He is taking and tolerating Vimpat, Divalproex ER and Levetiracetam for his seizure disorder   Today's concerns: Mom reports that Randall Anderson has had 4-5 brief seizures since his last visit. One occurred in [place] and he fell to the floor. Fortunately he was not injured.  Mom reports that he has occasionally missed Vimpat doses when he ran out of medication and the pharmacy had to order it.  He has ongoing intermittent angry mood but overall Mom feels that he has been doing well since his last visit.  He has occasional headaches. Some require that he lie down to obtain relief.  Randall Anderson has been otherwise generally healthy since he was last seen. No health concerns today other than previously mentioned.  Review of systems: Please see HPI for neurologic and other pertinent review of systems. Otherwise all other systems were reviewed and were negative.  Problem List: Patient Active Problem List   Diagnosis Date Noted   Migraine without aura and without status migrainosus, not intractable 12/31/2015   Delay of cognitive development 07/01/2015   Transient alteration of awareness 07/27/2014   Recurrent falls 07/07/2014   Episodic tension-type headache, not intractable 04/17/2013   Long-term use of high-risk medication 04/17/2013   Obstructive hydrocephalus (HCC) 04/17/2013   Generalized convulsive epilepsy (HCC) 04/17/2013   Depressive disorder, not elsewhere classified 04/17/2013   Episodic mood disorder  (HCC) 04/17/2013   Personal history of methicillin resistant Staphylococcus aureus 04/17/2013     Past Medical History:  Diagnosis Date   Congenital hydrocephalus (HCC)    Hx MRSA infection    Intellectual delay    Seizures (HCC)    VP (ventriculoperitoneal) shunt status     Past medical history comments: See HPI Copied from previous record: In October 2015, [name] was falling frequently. He had CT scan of the head at that time that was unchanged from prior studies. The falling episodes gradually diminished and resolved without intervention.   Surgical history: Past Surgical History:  Procedure Laterality Date   CIRCUMCISION  1989   VENTRICULOPERITONEAL SHUNT     VP shunt,  endoscopic surgery to open up the floor of third ventricle and have it communicate with the subarachnoid space.      Family history: family history includes Cancer in his father, maternal grandfather, and paternal grandfather; Congestive Heart Failure in his maternal grandmother; Diabetes in an other family member.   Social history: Social History   Socioeconomic History   Marital status: Single    Spouse name: Not on file   Number of children: Not on file   Years of education: Not on file   Highest education level: Not on file  Occupational History   Not on file  Tobacco Use   Smoking status: Never    Passive exposure: Yes   Smokeless tobacco: Never   Tobacco comments:    Mom smokes   Substance and Sexual Activity   Alcohol use: No    Alcohol/week: 0.0 standard drinks of alcohol   Drug use: No   Sexual activity: Never  Other  Topics Concern   Not on file  Social History Narrative   [name] graduated from [employer] in 2008. [name] enjoys washing dishes, vacuuming, playing on the computer, and going to church.   [name] lives with his mother. He has 1 older and 1 younger siblings that do not reside in the home.    Social Determinants of Health   Financial Resource Strain: Not on  file  Food Insecurity: No Food Insecurity (07/06/2021)   Received from [facility], [facility]   Hunger Vital Sign    Worried About Running Out of Food in the Last Year: Never true    Ran Out of Food in the Last Year: Never true  Transportation Needs: Not on file  Physical Activity: Not on file  Stress: Not on file  Social Connections: Socially Integrated (10/04/2022)   Received from [facility], [facility]   Social Network    How would you rate your social network (family, work, friends)?: Good participation with social networks  Intimate Partner Violence: Unknown (01/14/2022)   Received from [facility], [facility]   HITS    Physically Hurt: Not on file    Insult or Talk Down To: Not on file    Threaten Physical Harm: Not on file    Scream or Curse: Not on file    Past/failed meds:  Allergies: Allergies  Allergen Reactions   Other Anaphylaxis    Shell Fish    Immunizations:  There is no immunization history on file for this patient.   Diagnostics/Screenings: Copied from previous record: 07/05/2014 - CT head wo contrast ([facility]) - significant hydrocephalus - unchanged from prior studies. No acute intracranial abnormalities.    Physical Exam: BP 122/62   Pulse 60   Ht 5' 0.35" (1.533 m)   Wt 172 lb 6.4 oz (78.2 kg)   BMI 33.28 kg/m   General: short statured but well developed, well nourished man, seated on exam table, in no evident distress Head: normocephalic and atraumatic. Oropharynx difficult to examine but appears benign. No dysmorphic features. Neck: supple Cardiovascular: regular rate and rhythm, no murmurs. Respiratory: clear to auscultation bilaterally Abdomen: bowel sounds present all four quadrants, abdomen soft, non-tender, non-distended. Musculoskeletal: no skeletal deformities or obvious scoliosis.  Skin: no rashes or neurocutaneous lesions  Neurologic Exam Mental Status: awake and fully alert. Oriented to time and place. Fund of  knowledge subnormal for age. Appropriate mood. Needed some redirection at times. Cranial Nerves: fundoscopic exam - red reflex present.  Unable to fully visualize fundus.  Pupils equal briskly reactive to light.  Turns to localize faces and objects in the periphery. Turns to localize sounds in the periphery. Facial movements are symmetric Motor: normal bulk, tone and strength Sensory: withdrawal x 4 Coordination: unable to adequately assess due to patient's inability to participate in examination. No dysmetria with reach for objects. Gait and Station: able to walk independently. Tends to walk quickly on and his toes but can get his heels down when reminded to slow down. Reflexes: diminished and symmetric.   Impression: Generalized convulsive epilepsy (HCC) - Plan: levETIRAcetam (KEPPRA) 1000 MG tablet, divalproex (DEPAKOTE ER) 500 MG 24 hr tablet, Lacosamide (VIMPAT) 150 MG TABS  Migraine without aura and without status migrainosus, not intractable  Obstructive hydrocephalus (HCC)  Delay of cognitive development  Episodic tension-type headache, not intractable  Episodic mood disorder (HCC)   Recommendations for plan of care: The patient's previous Epic records were reviewed. No recent diagnostic studies to be reviewed  with the patient. I talked with Mom about the seizures and recommended that we make some changes in Dow's medication. I would like to simplify his regimen as well as reduce seizure frequency. Because there are times in which he has missed doses of Vimpat, I gave Mom samples of Motpoly XR to use to bridge these events.  Plan until next visit: Increase Levetiracetam to 2000mg  BID Increase Vimpat to 300mg  at bedtime. Continue other medications as prescribed  Call if more seizures occur or for other questions or concerns Return in about 6 months (around 02/06/2024).  The medication list was reviewed and reconciled. I reviewed the changes that were made in the prescribed  medications today. A complete medication list was provided to the patient.  Allergies as of 08/08/2023       Reactions   Other Anaphylaxis   Shell Fish   Shellfish Allergy         Medication List        Accurate as of August 08, 2023 11:59 PM. If you have any questions, ask your nurse or doctor.          acetaminophen 500 MG tablet Commonly known as: TYLENOL Take 500 mg by mouth every 4 (four) hours as needed.   divalproex 500 MG 24 hr tablet Commonly known as: DEPAKOTE ER Take 1 tablet (500 mg total) by mouth 3 (three) times daily.   Lacosamide 150 MG Tabs Commonly known as: Vimpat Take 2 tablets (300 mg total) by mouth at bedtime. What changed:  how much to take Another medication with the same name was removed. Continue taking this medication, and follow the directions you see here. Changed by: Elveria Rising   levETIRAcetam 1000 MG tablet Commonly known as: KEPPRA Take 2 tablets in the morning and take 2 tablets at night What changed:  medication strength See the new instructions. Changed by: Elveria Rising   propranolol 10 MG tablet Commonly known as: INDERAL TAKE 3 TABLETS BY MOUTH AT BEDTIME   Ventolin HFA 108 (90 Base) MCG/ACT inhaler Generic drug: albuterol Inhale 2 puffs into the lungs every 6 (six) hours as needed.      Total time spent with the patient was 30 minutes, of which 50% or more was spent in counseling and coordination of care.  Elveria Rising NP-C [facility] [address] [place], [place] [address] Ph. [phone] Fax [phone]

## 2023-08-08 ENCOUNTER — Ambulatory Visit (INDEPENDENT_AMBULATORY_CARE_PROVIDER_SITE_OTHER): Payer: Medicaid Other | Admitting: Family

## 2023-08-08 ENCOUNTER — Encounter (INDEPENDENT_AMBULATORY_CARE_PROVIDER_SITE_OTHER): Payer: Self-pay | Admitting: Family

## 2023-08-08 VITALS — BP 122/62 | HR 60 | Ht 60.35 in | Wt 172.4 lb

## 2023-08-08 DIAGNOSIS — F39 Unspecified mood [affective] disorder: Secondary | ICD-10-CM

## 2023-08-08 DIAGNOSIS — F819 Developmental disorder of scholastic skills, unspecified: Secondary | ICD-10-CM | POA: Diagnosis not present

## 2023-08-08 DIAGNOSIS — G43009 Migraine without aura, not intractable, without status migrainosus: Secondary | ICD-10-CM | POA: Diagnosis not present

## 2023-08-08 DIAGNOSIS — G911 Obstructive hydrocephalus: Secondary | ICD-10-CM | POA: Diagnosis not present

## 2023-08-08 DIAGNOSIS — G40309 Generalized idiopathic epilepsy and epileptic syndromes, not intractable, without status epilepticus: Secondary | ICD-10-CM | POA: Diagnosis not present

## 2023-08-08 DIAGNOSIS — G44219 Episodic tension-type headache, not intractable: Secondary | ICD-10-CM

## 2023-08-08 MED ORDER — LEVETIRACETAM 1000 MG PO TABS: ORAL_TABLET | ORAL | 5 refills | Status: DC

## 2023-08-08 MED ORDER — DIVALPROEX SODIUM ER 500 MG PO TB24: 500.0000 mg | ORAL_TABLET | Freq: Three times a day (TID) | ORAL | 3 refills | Status: DC

## 2023-08-08 MED ORDER — LACOSAMIDE 150 MG PO TABS: 2.0000 | ORAL_TABLET | Freq: Every evening | ORAL | 0 refills | Status: DC

## 2023-08-09 ENCOUNTER — Encounter (INDEPENDENT_AMBULATORY_CARE_PROVIDER_SITE_OTHER): Payer: Self-pay | Admitting: Family

## 2023-08-27 ENCOUNTER — Other Ambulatory Visit (INDEPENDENT_AMBULATORY_CARE_PROVIDER_SITE_OTHER): Payer: Self-pay | Admitting: Neurology

## 2023-08-27 DIAGNOSIS — G40309 Generalized idiopathic epilepsy and epileptic syndromes, not intractable, without status epilepticus: Secondary | ICD-10-CM

## 2024-01-21 ENCOUNTER — Other Ambulatory Visit (INDEPENDENT_AMBULATORY_CARE_PROVIDER_SITE_OTHER): Payer: Self-pay | Admitting: Family

## 2024-01-21 DIAGNOSIS — G43009 Migraine without aura, not intractable, without status migrainosus: Secondary | ICD-10-CM

## 2024-02-08 ENCOUNTER — Encounter (INDEPENDENT_AMBULATORY_CARE_PROVIDER_SITE_OTHER): Payer: Self-pay | Admitting: Family

## 2024-02-08 ENCOUNTER — Ambulatory Visit (INDEPENDENT_AMBULATORY_CARE_PROVIDER_SITE_OTHER): Payer: Medicaid Other | Admitting: Family

## 2024-02-08 VITALS — BP 112/76 | HR 64 | Ht 60.24 in | Wt 167.1 lb

## 2024-02-08 DIAGNOSIS — G44219 Episodic tension-type headache, not intractable: Secondary | ICD-10-CM

## 2024-02-08 DIAGNOSIS — G43009 Migraine without aura, not intractable, without status migrainosus: Secondary | ICD-10-CM | POA: Diagnosis not present

## 2024-02-08 DIAGNOSIS — F819 Developmental disorder of scholastic skills, unspecified: Secondary | ICD-10-CM | POA: Diagnosis not present

## 2024-02-08 DIAGNOSIS — F39 Unspecified mood [affective] disorder: Secondary | ICD-10-CM

## 2024-02-08 DIAGNOSIS — G911 Obstructive hydrocephalus: Secondary | ICD-10-CM

## 2024-02-08 DIAGNOSIS — G40309 Generalized idiopathic epilepsy and epileptic syndromes, not intractable, without status epilepticus: Secondary | ICD-10-CM

## 2024-02-08 MED ORDER — PROPRANOLOL HCL 10 MG PO TABS: 30.0000 mg | ORAL_TABLET | Freq: Every day | ORAL | 1 refills | Status: DC

## 2024-02-08 MED ORDER — LEVETIRACETAM 1000 MG PO TABS: ORAL_TABLET | ORAL | 5 refills | Status: DC

## 2024-02-08 MED ORDER — VIMPAT 150 MG PO TABS: 1.0000 | ORAL_TABLET | Freq: Every day | ORAL | 5 refills | Status: DC

## 2024-02-08 MED ORDER — DIVALPROEX SODIUM ER 500 MG PO TB24: 500.0000 mg | ORAL_TABLET | Freq: Three times a day (TID) | ORAL | 3 refills | Status: DC

## 2024-02-08 NOTE — Patient Instructions (Signed)
 It was a pleasure to see you today!  Instructions for you until your next appointment are as follows: Continue your medications as prescribed Let me know if you have seizures or if you have any concerns Please sign up for MyChart if you have not done so. Please plan to return for follow up in 6 months or sooner if needed.  Feel free to contact our office during normal business hours at [phone] with questions or concerns. If there is no answer or the call is outside business hours, please leave a message and our clinic staff will call you back within the next business day.  If you have an urgent concern, please stay on the line for our after-hours answering service and ask for the on-call neurologist.     I also encourage you to use MyChart to communicate with me more directly. If you have not yet signed up for MyChart within [facility], the front desk staff can help you. However, please note that this inbox is NOT monitored on nights or weekends, and response can take up to 2 business days.  Urgent matters should be discussed with the on-call pediatric neurologist.   At [facility], we are committed to providing exceptional care. You will receive a patient satisfaction survey through text or email regarding your visit today. Your opinion is important to me. Comments are appreciated.

## 2024-02-08 NOTE — Progress Notes (Signed)
 SANJEEV RENNA   MRN:  [id number]  July 23, 1986   Provider: Lyndol Santee NP-C Location of Care: [facility]  Visit type: Return visit  Last visit: 08/08/2023  Referral source: Ronell Coe, NP History from: Epic chart, patient and his mother  Brief history:  Copied from previous record: History of congenital hydrocephalus treated with VP shunt, cognitive delay, migraine and tension headaches, seizures, and episodes of angry mood. He is taking and tolerating Vimpat , Divalproex  ER and Levetiracetam  for his seizure disorder   Today's concerns: Mom reports that he has had some intermittent brief staring seizures but that he has had no convulsive seizures since was last seen.  He reports occasional headaches. They are typically not severe.  He can have angry mood at times but Mom reports that this has not been problematic. Jeral has been otherwise generally healthy since he was last seen. No health concerns today other than previously mentioned.  Review of systems: Please see HPI for neurologic and other pertinent review of systems. Otherwise all other systems were reviewed and were negative.  Problem List: Patient Active Problem List   Diagnosis Date Noted   Migraine without aura and without status migrainosus, not intractable 12/31/2015   Delay of cognitive development 07/01/2015   Transient alteration of awareness 07/27/2014   Recurrent falls 07/07/2014   Episodic tension-type headache, not intractable 04/17/2013   Long-term use of high-risk medication 04/17/2013   Obstructive hydrocephalus (HCC) 04/17/2013   Generalized convulsive epilepsy (HCC) 04/17/2013   Depressive disorder, not elsewhere classified 04/17/2013   Episodic mood disorder (HCC) 04/17/2013   Personal history of methicillin resistant Staphylococcus aureus 04/17/2013     Past Medical History:  Diagnosis Date   Congenital hydrocephalus (HCC)    Hx MRSA  infection    Intellectual delay    Seizures (HCC)    VP (ventriculoperitoneal) shunt status     Past medical history comments: See HPI Copied from previous record: In October 2015, Erla Haw was falling frequently. He had CT scan of the head at that time that was unchanged from prior studies. The falling episodes gradually diminished and resolved without intervention.   Surgical history: Past Surgical History:  Procedure Laterality Date   CIRCUMCISION  1989   VENTRICULOPERITONEAL SHUNT     VP shunt,  endoscopic surgery to open up the floor of third ventricle and have it communicate with the subarachnoid space.      Family history: family history includes Cancer in his father, maternal grandfather, and paternal grandfather; Congestive Heart Failure in his maternal grandmother; Diabetes in an other family member.   Social history: Social History   Socioeconomic History   Marital status: Single    Spouse name: Not on file   Number of children: Not on file   Years of education: Not on file   Highest education level: Not on file  Occupational History   Not on file  Tobacco Use   Smoking status: Never    Passive exposure: Yes   Smokeless tobacco: Never   Tobacco comments:    Mom smokes   Substance and Sexual Activity   Alcohol use: No    Alcohol/week: 0.0 standard drinks of alcohol   Drug use: No   Sexual activity: Never  Other Topics Concern   Not on file  Social History Narrative   Keyth graduated from [employer] in 2008. Rj enjoys washing dishes, vacuuming, playing on the computer, and going to church.   Theotis Flake  lives with his mother. He has 1 older and 1 younger siblings that do not reside in the home.    Social Drivers of Health   Financial Resource Strain: Patient Declined (10/10/2023)   Received from [facility]   Overall Financial Resource Strain (CARDIA)    Difficulty of Paying Living Expenses: Patient declined  Food Insecurity: Patient Declined  (10/10/2023)   Received from [facility]   Hunger Vital Sign    Worried About Running Out of Food in the Last Year: Patient declined    Ran Out of Food in the Last Year: Patient declined  Transportation Needs: No Transportation Needs (10/10/2023)   Received from [facility]   PRAPARE - Transportation    Lack of Transportation (Medical): No    Lack of Transportation (Non-Medical): No  Physical Activity: Unknown (10/10/2023)   Received from [facility]   Exercise Vital Sign    Days of Exercise per Week: 0 days    Minutes of Exercise per Session: Not on file  Stress: Patient Declined (10/10/2023)   Received from [facility] - Occupational Stress Questionnaire    Feeling of Stress : Patient declined  Social Connections: Patient Declined (10/10/2023)   Received from [facility]   Social Network    How would you rate your social network (family, work, friends)?: Patient declined  Intimate Partner Violence: Not At Risk (10/10/2023)   Received from [facility]   HITS    Over the last 12 months how often did your partner physically hurt you?: Never    Over the last 12 months how often did your partner insult you or talk down to you?: Never    Over the last 12 months how often did your partner threaten you with physical harm?: Never    Over the last 12 months how often did your partner scream or curse at you?: Never    Past/failed meds:  Allergies: Allergies  Allergen Reactions   Other Anaphylaxis    Shell Fish   Shellfish Allergy     Immunizations:  There is no immunization history on file for this patient.   Diagnostics/Screenings: Copied from previous record: 07/05/2014 - CT head wo contrast ([facility]) - significant hydrocephalus - unchanged from prior studies. No acute intracranial abnormalities.    Physical Exam: BP 112/76   Pulse 64   Ht 5' 0.24" (1.53 m)   Wt 167 lb 1.7 oz (75.8 kg)   BMI 32.38 kg/m    General: short statured, well developed, well nourished man, seated on exam table, in no evident distress Head: normocephalic and atraumatic. Oropharynx difficult to examine but appears benign. No dysmorphic features. Neck: supple Cardiovascular: regular rate and rhythm, no murmurs. Respiratory: clear to auscultation bilaterally Abdomen: bowel sounds present all four quadrants, abdomen soft, non-tender, non-distended. Musculoskeletal: no skeletal deformities or obvious scoliosis. Skin: no rashes or neurocutaneous lesions  Neurologic Exam Mental Status: awake and fully alert. Oriented to time and place. Fund of knowledge subnormal for age. Speech with mild articulation differences. Variable eye contact. Able to follow commands but needed frequent coaching and redirection.  Cranial Nerves: fundoscopic exam - red reflex present.  Unable to fully visualize fundus.  Pupils equal briskly reactive to light.  Turns to localize faces and objects in the periphery. Turns to localize sounds in the periphery. Facial movements are symmetric. Motor: normal functional bulk, tone and strength Sensory: withdrawal x 4 Coordination: unable to adequately assess due to patient's inability  to participate in examination. No dysmetria with reach for objects. Gait and Station: able to walk independently. Tends ot walk quickly and on his toes but can get his heels down when reminded to walk slower  Impression: Generalized convulsive epilepsy (HCC) - Plan: divalproex  (DEPAKOTE  ER) 500 MG 24 hr tablet, levETIRAcetam  (KEPPRA ) 1000 MG tablet, VIMPAT  150 MG TABS  Migraine without aura and without status migrainosus, not intractable - Plan: propranolol  (INDERAL ) 10 MG tablet  Obstructive hydrocephalus (HCC)  Delay of cognitive development  Episodic tension-type headache, not intractable  Episodic mood disorder (HCC)   Recommendations for plan of care: The patient's previous Epic records were reviewed. No recent  diagnostic studies to be reviewed with the patient. I talked with Mom about Leul's medication. I would like to change him to Motpoly  XR to simplify his regimen but it is not covered by his insurance. He will continue his medication without change for now.  Plan until next visit: Continue medications as prescribed  Call if convulsive seizures occur or for questions or concerns Return in about 6 months (around 08/09/2024).  The medication list was reviewed and reconciled. No changes were made in the prescribed medications today. A complete medication list was provided to the patient.  Allergies as of 02/08/2024       Reactions   Other Anaphylaxis   Shell Fish   Shellfish Allergy         Medication List        Accurate as of February 08, 2024  6:58 PM. If you have any questions, ask your nurse or doctor.          acetaminophen 500 MG tablet Commonly known as: TYLENOL Take 500 mg by mouth every 4 (four) hours as needed.   divalproex  500 MG 24 hr tablet Commonly known as: DEPAKOTE  ER Take 1 tablet (500 mg total) by mouth 3 (three) times daily.   levETIRAcetam  1000 MG tablet Commonly known as: KEPPRA  Take 2 tablets in the morning and take 2 tablets at night   propranolol  10 MG tablet Commonly known as: INDERAL  Take 3 tablets (30 mg total) by mouth at bedtime.   Ventolin HFA 108 (90 Base) MCG/ACT inhaler Generic drug: albuterol Inhale 2 puffs into the lungs every 6 (six) hours as needed.   Vimpat  150 MG Tabs Generic drug: Lacosamide  Take 1 tablet (150 mg total) by mouth at bedtime.      Total time spent with the patient was 25 minutes, of which 50% or more was spent in counseling and coordination of care.  Lyndol Santee NP-C [facility] [address], [address] [place], [place] [address] Ph. [phone] Fax [phone]

## 2024-08-14 ENCOUNTER — Encounter (INDEPENDENT_AMBULATORY_CARE_PROVIDER_SITE_OTHER): Payer: Self-pay | Admitting: Family

## 2024-08-14 ENCOUNTER — Ambulatory Visit (INDEPENDENT_AMBULATORY_CARE_PROVIDER_SITE_OTHER): Admitting: Family

## 2024-08-14 VITALS — BP 140/90 | HR 76 | Ht 60.24 in | Wt 163.0 lb

## 2024-08-14 DIAGNOSIS — G40309 Generalized idiopathic epilepsy and epileptic syndromes, not intractable, without status epilepticus: Secondary | ICD-10-CM | POA: Diagnosis not present

## 2024-08-14 DIAGNOSIS — Z79899 Other long term (current) drug therapy: Secondary | ICD-10-CM

## 2024-08-14 DIAGNOSIS — F819 Developmental disorder of scholastic skills, unspecified: Secondary | ICD-10-CM | POA: Diagnosis not present

## 2024-08-14 DIAGNOSIS — F39 Unspecified mood [affective] disorder: Secondary | ICD-10-CM

## 2024-08-14 DIAGNOSIS — G43009 Migraine without aura, not intractable, without status migrainosus: Secondary | ICD-10-CM | POA: Diagnosis not present

## 2024-08-14 DIAGNOSIS — G911 Obstructive hydrocephalus: Secondary | ICD-10-CM

## 2024-08-14 DIAGNOSIS — G250 Essential tremor: Secondary | ICD-10-CM

## 2024-08-14 DIAGNOSIS — G44219 Episodic tension-type headache, not intractable: Secondary | ICD-10-CM

## 2024-08-14 MED ORDER — LEVETIRACETAM 1000 MG PO TABS: ORAL_TABLET | ORAL | 5 refills | Status: AC

## 2024-08-14 MED ORDER — DIVALPROEX SODIUM ER 500 MG PO TB24: 500.0000 mg | ORAL_TABLET | Freq: Three times a day (TID) | ORAL | 3 refills | Status: AC

## 2024-08-14 MED ORDER — VIMPAT 150 MG PO TABS: 1.0000 | ORAL_TABLET | Freq: Every day | ORAL | 5 refills | Status: AC

## 2024-08-14 MED ORDER — PROPRANOLOL HCL 10 MG PO TABS: 40.0000 mg | ORAL_TABLET | Freq: Every day | ORAL | 1 refills | Status: AC

## 2024-08-14 NOTE — Patient Instructions (Signed)
 It was a pleasure to see you today!  Instructions for you until your next appointment are as follows: Continue the seizure medications as prescribed Increase Propranolol  to 4 tablets (40mg ) at bedtime to see if that helps with tremor as well as preventing headaches Call if seizures occur or if you have any questions.  Please sign up for MyChart if you have not done so. Please plan to return for follow up in 6 months or sooner if needed.  Feel free to contact our office during normal business hours at [phone] with questions or concerns. If there is no answer or the call is outside business hours, please leave a message and our clinic staff will call you back within the next business day.  If you have an urgent concern, please stay on the line for our after-hours answering service and ask for the on-call neurologist.     I also encourage you to use MyChart to communicate with me more directly. If you have not yet signed up for MyChart within [facility], the front desk staff can help you. However, please note that this inbox is NOT monitored on nights or weekends, and response can take up to 2 business days.  Urgent matters should be discussed with the on-call pediatric neurologist.   At [facility], we are committed to providing exceptional care. You will receive a patient satisfaction survey through text or email regarding your visit today. Your opinion is important to me. Comments are appreciated.

## 2024-08-14 NOTE — Progress Notes (Unsigned)
 Randall Anderson   MRN:  [id number]  16-Nov-1985   Provider: Ellouise Bollman NP-C Location of Care: [facility] Complex [facility]  Visit type: Return visit  Last visit: 02/08/2024  Referral source: Lacinda Moritz, NP PCP: Lacinda Moritz, NP History from: Epic chart, patient and his mother  Brief history:  Copied from previous record: History of congenital hydrocephalus treated with VP shunt, cognitive delay, migraine and tension headaches, seizures, and episodes of angry mood. He is taking and tolerating Vimpat , Divalproex  ER and Levetiracetam  for his seizure disorder. He is taking Propranolol  for migraine prevention.  Since last visit: Mom reports that he had a brief seizure a few weeks ago. She said that it was convulsive and lasted less than 1 minute. She denies that he has missed medication doses and says that he usually sleeps well. He has occasional headache that are not severe.  He has essential tremor and reports that sometimes he has trouble holding a cup or eating because of shaking.  He has occasional angry mood but Mom says that it is usually manageable. Randall Anderson has been otherwise generally healthy since he was last seen. No health concerns today other than previously mentioned.  Review of systems: Please see HPI for neurologic and other pertinent review of systems. Otherwise all other systems were reviewed and were negative.  Problem List: Patient Active Problem List   Diagnosis Date Noted   Migraine without aura and without status migrainosus, not intractable 12/31/2015   Delay of cognitive development 07/01/2015   Transient alteration of awareness 07/27/2014   Recurrent falls 07/07/2014   Episodic tension-type headache, not intractable 04/17/2013   Long-term use of high-risk medication 04/17/2013   Obstructive hydrocephalus (HCC) 04/17/2013   Generalized convulsive epilepsy (HCC) 04/17/2013   Depressive disorder, not elsewhere  classified 04/17/2013   Episodic mood disorder (HCC) 04/17/2013   Personal history of methicillin resistant Staphylococcus aureus 04/17/2013     Past Medical History:  Diagnosis Date   Congenital hydrocephalus (HCC)    Hx MRSA infection    Intellectual delay    Seizures (HCC)    VP (ventriculoperitoneal) shunt status     Past medical history comments: See HPI Copied from previous record: In October 2015, Randall Anderson was falling frequently. He had CT scan of the head at that time that was unchanged from prior studies. The falling episodes gradually diminished and resolved without intervention.   Surgical history: Past Surgical History:  Procedure Laterality Date   CIRCUMCISION  1989   VENTRICULOPERITONEAL SHUNT     VP shunt,  endoscopic surgery to open up the floor of third ventricle and have it communicate with the subarachnoid space.     Family history: family history includes Cancer in his father, maternal grandfather, and paternal grandfather; Congestive Heart Failure in his maternal grandmother; Diabetes in an other family member.   Social history: Social History   Socioeconomic History   Marital status: Single    Spouse name: Not on file   Number of children: Not on file   Years of education: Not on file   Highest education level: Not on file  Occupational History   Not on file  Tobacco Use   Smoking status: Never    Passive exposure: Yes   Smokeless tobacco: Never   Tobacco comments:    Mom smokes   Substance and Sexual Activity   Alcohol use: No    Alcohol/week: 0.0 standard drinks of alcohol   Drug use: No   Sexual  activity: Never  Other Topics Concern   Not on file  Social History Narrative   Randall Anderson graduated from [employer] in 2008. Randall Anderson enjoys washing dishes, vacuuming, playing on the computer, and going to church.   Randall Anderson lives with his mother. He has 1 older and 1 younger siblings that do not reside in the home.    Social Drivers of Health    Financial Resource Strain: Patient Declined (10/10/2023)   Received from [facility]   Overall Financial Resource Strain (CARDIA)    Difficulty of Paying Living Expenses: Patient declined  Food Insecurity: Patient Declined (10/10/2023)   Received from [facility]   Hunger Vital Sign    Within the past 12 months, you worried that your food would run out before you got the money to buy more.: Patient declined    Within the past 12 months, the food you bought just didn't last and you didn't have money to get more.: Patient declined  Transportation Needs: No Transportation Needs (10/10/2023)   Received from [facility] - Transportation    Lack of Transportation (Medical): No    Lack of Transportation (Non-Medical): No  Physical Activity: Unknown (10/10/2023)   Received from [facility]   Exercise Vital Sign    On average, how many days per week do you engage in moderate to strenuous exercise (like a brisk walk)?: 0 days    Minutes of Exercise per Session: Not on file  Stress: Patient Declined (10/10/2023)   Received from [facility] - Occupational Stress Questionnaire    Feeling of Stress : Patient declined  Social Connections: Patient Declined (10/10/2023)   Received from [facility]   Social Network    How would you rate your social network (family, work, friends)?: Patient declined  Intimate Partner Violence: Not At Risk (10/10/2023)   Received from [facility]   HITS    Over the last 12 months how often did your partner physically hurt you?: Never    Over the last 12 months how often did your partner insult you or talk down to you?: Never    Over the last 12 months how often did your partner threaten you with physical harm?: Never    Over the last 12 months how often did your partner scream or curse at you?: Never   Past/failed meds:  Allergies: Allergies  Allergen Reactions   Other Anaphylaxis    Shell  Fish   Shellfish Allergy     Immunizations:  There is no immunization history on file for this patient.   Diagnostics/Screenings: Copied from previous record: 07/05/2014 - CT head wo contrast ([facility]) - significant hydrocephalus - unchanged from prior studies. No acute intracranial abnormalities.    Physical Exam: BP (!) 140/90 (BP Location: Left Arm, Patient Position: Sitting, Cuff Size: Normal)   Pulse 76   Ht 5' 0.24 (1.53 m)   Wt 163 lb (73.9 kg)   BMI 31.58 kg/m   General: Short statured, well developed, well nourished man, seated on exam table, in no evident distress Head: Head normocephalic and atraumatic.  Oropharynx benign. Neck: Supple Cardiovascular: Regular rate and rhythm, no murmurs Respiratory: Breath sounds clear to auscultation Musculoskeletal: No obvious deformities or scoliosis Skin: No rashes or neurocutaneous lesions  Neurologic Exam Mental Status: Awake and fully alert.  Oriented to place and time. Attention span, concentration, and fund of knowledge subnormal for age. Limited eye contact. Able to follow commands  but needs some coaching and redirection. Cranial Nerves: Fundoscopic exam reveals red reflex.  Pupils equal, briskly reactive to light. Turns to localize faces, objects and sounds in the periphery. Face tongue, palate move normally and symmetrically. Shoulder shrug normal Motor: Normal functional bulk, tone and strength Sensory: Withdrawal x 4 Coordination: No dysmetria with reach for objects Gait and Station: Able to walk independently. Tends to walk quickly and on his toes.   Impression: Generalized convulsive epilepsy (HCC) - Plan: divalproex  (DEPAKOTE  ER) 500 MG 24 hr tablet, levETIRAcetam  (KEPPRA ) 1000 MG tablet, VIMPAT  150 MG TABS  Migraine without aura and without status migrainosus, not intractable - Plan: propranolol  (INDERAL ) 10 MG tablet  Obstructive hydrocephalus (HCC)  Delay of cognitive development  Episodic tension-type  headache, not intractable  Episodic mood disorder  Long-term use of high-risk medication  Benign essential tremor - Plan: propranolol  (INDERAL ) 10 MG tablet   Recommendations for plan of care: The patient's previous Epic records were reviewed. No recent diagnostic studies to be reviewed with the patient.   Recommendations and plan until next visit: Increase Propranolol  to 40mg  to see if it will help with tremor.  Continue other medications as prescribed  Call if seizures occur or for questions or concerns Return in about 6 months (around 02/11/2025).  The medication list was reviewed and reconciled. No changes were made in the prescribed medications today. A complete medication list was provided to the patient.  Allergies as of 08/14/2024       Reactions   Other Anaphylaxis   Shell Fish   Shellfish Allergy         Medication List        Accurate as of August 14, 2024 11:59 PM. If you have any questions, ask your nurse or doctor.          acetaminophen 500 MG tablet Commonly known as: TYLENOL Take 500 mg by mouth every 4 (four) hours as needed.   divalproex  500 MG 24 hr tablet Commonly known as: DEPAKOTE  ER Take 1 tablet (500 mg total) by mouth 3 (three) times daily.   levETIRAcetam  1000 MG tablet Commonly known as: KEPPRA  Take 2 tablets in the morning and take 2 tablets at night   propranolol  10 MG tablet Commonly known as: INDERAL  Take 4 tablets (40 mg total) by mouth at bedtime. What changed: how much to take Changed by: Ellouise Bollman   Ventolin HFA 108 (90 Base) MCG/ACT inhaler Generic drug: albuterol Inhale 2 puffs into the lungs every 6 (six) hours as needed.   Vimpat  150 MG Tabs Generic drug: Lacosamide  Take 1 tablet (150 mg total) by mouth at bedtime.      Total time spent with the patient was 25 minutes, of which 50% or more was spent in counseling and coordination of care.  Ellouise Bollman NP-C  [facility]rology [facility] [address], [address] [place], [place] [address] Ph. [phone] Fax [phone]

## 2024-08-15 ENCOUNTER — Encounter (INDEPENDENT_AMBULATORY_CARE_PROVIDER_SITE_OTHER): Payer: Self-pay | Admitting: Family

## 2024-08-15 DIAGNOSIS — G250 Essential tremor: Secondary | ICD-10-CM | POA: Insufficient documentation

## 2024-09-01 ENCOUNTER — Other Ambulatory Visit (INDEPENDENT_AMBULATORY_CARE_PROVIDER_SITE_OTHER): Payer: Self-pay | Admitting: Family

## 2024-09-01 DIAGNOSIS — G40309 Generalized idiopathic epilepsy and epileptic syndromes, not intractable, without status epilepticus: Secondary | ICD-10-CM

## 2025-02-12 ENCOUNTER — Ambulatory Visit (INDEPENDENT_AMBULATORY_CARE_PROVIDER_SITE_OTHER): Admitting: Family
